# Patient Record
Sex: Female | Born: 1985 | Race: White | Hispanic: No | Marital: Single | Smoking: Current every day smoker
Health system: Southern US, Community
[De-identification: ages and names within clinical notes are randomized; demographics above are authoritative.]

## PROBLEM LIST (undated history)

## (undated) DIAGNOSIS — I10 Essential (primary) hypertension: Secondary | ICD-10-CM

## (undated) DIAGNOSIS — J449 Chronic obstructive pulmonary disease, unspecified: Secondary | ICD-10-CM

## (undated) DIAGNOSIS — E079 Disorder of thyroid, unspecified: Secondary | ICD-10-CM

## (undated) DIAGNOSIS — M1990 Unspecified osteoarthritis, unspecified site: Secondary | ICD-10-CM

---
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
## 2003-07-04 ENCOUNTER — Inpatient Hospital Stay (HOSPITAL_COMMUNITY): Admission: AD | Admit: 2003-07-04 | Discharge: 2003-07-04 | Payer: Self-pay | Admitting: *Deleted

## 2004-09-06 ENCOUNTER — Emergency Department: Payer: Self-pay | Admitting: Internal Medicine

## 2004-09-12 ENCOUNTER — Emergency Department: Payer: Self-pay | Admitting: Unknown Physician Specialty

## 2010-11-27 ENCOUNTER — Inpatient Hospital Stay: Payer: Self-pay | Admitting: Psychiatry

## 2010-12-25 ENCOUNTER — Inpatient Hospital Stay: Payer: Self-pay | Admitting: Psychiatry

## 2011-01-23 ENCOUNTER — Emergency Department: Payer: Self-pay | Admitting: Emergency Medicine

## 2018-05-01 ENCOUNTER — Other Ambulatory Visit: Payer: Self-pay

## 2018-05-01 ENCOUNTER — Emergency Department: Payer: Self-pay

## 2018-05-01 ENCOUNTER — Emergency Department
Admission: EM | Admit: 2018-05-01 | Discharge: 2018-05-01 | Disposition: A | Discharge status: Home / Self Care | Payer: Self-pay | Attending: Emergency Medicine | Admitting: Emergency Medicine

## 2018-05-01 ENCOUNTER — Encounter: Payer: Self-pay | Admitting: Emergency Medicine

## 2018-05-01 DIAGNOSIS — M25532 Pain in left wrist: Secondary | ICD-10-CM | POA: Insufficient documentation

## 2018-05-01 DIAGNOSIS — W01198A Fall on same level from slipping, tripping and stumbling with subsequent striking against other object, initial encounter: Secondary | ICD-10-CM | POA: Insufficient documentation

## 2018-05-01 DIAGNOSIS — Y93K1 Activity, walking an animal: Secondary | ICD-10-CM | POA: Insufficient documentation

## 2018-05-01 DIAGNOSIS — Y998 Other external cause status: Secondary | ICD-10-CM | POA: Insufficient documentation

## 2018-05-01 DIAGNOSIS — Y929 Unspecified place or not applicable: Secondary | ICD-10-CM | POA: Insufficient documentation

## 2018-05-01 DIAGNOSIS — Z5321 Procedure and treatment not carried out due to patient leaving prior to being seen by health care provider: Secondary | ICD-10-CM | POA: Insufficient documentation

## 2018-05-01 DIAGNOSIS — S52532A Colles' fracture of left radius, initial encounter for closed fracture: Secondary | ICD-10-CM | POA: Insufficient documentation

## 2018-05-01 MED ORDER — HYDROCODONE-ACETAMINOPHEN 5-325 MG PO TABS
1.0000 | ORAL_TABLET | Freq: Once | ORAL | Status: AC
  Administered 2018-05-01: 1 via ORAL
  Filled 2018-05-01: qty 1

## 2018-05-01 MED ORDER — MELOXICAM 15 MG PO TABS: 15.0000 mg | ORAL_TABLET | Freq: Every day | ORAL | 0 refills | Status: DC

## 2018-05-01 MED ORDER — HYDROCODONE-ACETAMINOPHEN 5-325 MG PO TABS: 1.0000 | ORAL_TABLET | Freq: Four times a day (QID) | ORAL | 0 refills | Status: AC | PRN

## 2018-05-01 NOTE — ED Notes (Signed)
No answer when called several times from lobby 

## 2018-05-01 NOTE — ED Triage Notes (Signed)
Pt reports fell 2 nights ago and caught herself with her hands and hurt her left wrist. Pt with swelling noted. Pt states that she was here last pm but left because she did not want to wait.

## 2018-05-01 NOTE — Discharge Instructions (Signed)
Please call and schedule an appointment with orthopedics.  Return to the ER for symptoms of concern if unable to see primary care or orthopedics.

## 2018-05-01 NOTE — ED Triage Notes (Signed)
Pt reports MD called her and told her to come back because she had a fracture.

## 2018-05-01 NOTE — ED Notes (Signed)
See triage note  States she fell 2 days ago  Caught herself with hands   But pain is mainly to left wrist  Good pulses

## 2018-05-01 NOTE — ED Notes (Addendum)
Notified pt, per direction of Dr Manson Passey, of positive wrist fracture per radiology report and need for immediate treatment. Pt states she was unable to wait earlier tonight "because my ride couldn't wait". Pt states she will return to the ED as soon as possible for treatment.

## 2018-05-01 NOTE — ED Triage Notes (Signed)
Patient ambulatory to triage with steady gait, without difficulty or distress noted; pt reports fell catching self with left wrist on Wednesday night; swelling,discoloration and pain; strong radial pulse, W&D

## 2018-05-01 NOTE — ED Provider Notes (Signed)
[facility] Emergency Department Provider Note ____________________________________________  Time seen: Approximately 6:39 PM  I have reviewed the triage vital signs and the nursing notes.   HISTORY  Chief Complaint Fall and Wrist Pain    HPI Kelly Patterson is a 33 y.o. female who presents to the emergency department for evaluation and treatment of left wrist pain.  2 nights ago she was walking her dog and the dog jerked the leash which caused her to fall forward.  She landed with her left hand outstretched.  Since that time she has had some pain and increase in swelling.  Tylenol and ibuprofen have been used with little relief of pain.  History reviewed. No pertinent past medical history.  There are no active problems to display for this patient.   History reviewed. No pertinent surgical history.  Prior to Admission medications   Medication Sig Start Date End Date Taking? Authorizing Provider  HYDROcodone-acetaminophen (NORCO/VICODIN) 5-325 MG tablet Take 1 tablet by mouth every 6 (six) hours as needed for up to 3 days for severe pain. 05/01/18 05/04/18  Winner Valeriano, Rulon Eisenmenger B, FNP  meloxicam (MOBIC) 15 MG tablet Take 1 tablet (15 mg total) by mouth daily. 05/01/18   Chinita Pester, FNP    Allergies Patient has no known allergies.  No family history on file.  Social History Social History   Tobacco Use  . Smoking status: Not on file  Substance Use Topics  . Alcohol use: Not on file  . Drug use: Not on file    Review of Systems Constitutional: Negative for fever. Cardiovascular: Negative for chest pain. Respiratory: Negative for shortness of breath. Musculoskeletal: Positive for left wrist pain. Skin: Positive for swelling of the left hand and wrist Neurological: Negative for decrease in sensation  ____________________________________________   PHYSICAL EXAM:  VITAL SIGNS: ED Triage Vitals  Enc Vitals Group     BP 05/01/18 1718 (!) 155/96     Pulse Rate 05/01/18 1718 100     Resp 05/01/18 1718 20     Temp 05/01/18 1718 97.8 F (36.6 C)     Temp Source 05/01/18 1718 Oral     SpO2 05/01/18 1718 98 %     Weight 05/01/18 1719 140 lb (63.5 kg)     Height 05/01/18 1719 5\' 6"  (1.676 m)     Head Circumference --      Peak Flow --      Pain Score 05/01/18 1719 8     Pain Loc --      Pain Edu? --      Excl. in GC? --     Constitutional: Alert and oriented. Well appearing and in no acute distress. Eyes: Conjunctivae are clear without discharge or drainage Head: Atraumatic Neck: Supple. Respiratory: No cough. Respirations are even and unlabored. Musculoskeletal: Swelling is noted proximal to the left wrist, diffusely over the left wrist and hand. Neurologic: Motor and sensory function is intact. Skin: No open wounds or lesions over the left arm, wrist, and hand.  Psychiatric: Affect and behavior are appropriate.  ____________________________________________   LABS (all labs ordered are listed, but only abnormal results are displayed)  Labs Reviewed - No data to display ____________________________________________  RADIOLOGY  Image taken at 0 1:40 AM shows an acute comminuted mildly impacted distal radial metaphyseal fracture that extends into the apices and involves the articular surface.  There is 3 mm of distraction of the major fracture fragments at the articular surface. ____________________________________________   PROCEDURES  .  Splint Application Date/Time: 05/01/2018 6:42 PM Performed by: Chinita Pester, FNP Authorized by: Chinita Pester, FNP   Consent:    Consent obtained:  Verbal   Consent given by:  Patient Pre-procedure details:    Sensation:  Normal Procedure details:    Laterality:  Left   Splint type:  Sugar tong   Supplies:  Cotton padding, elastic bandage and Ortho-Glass Post-procedure details:    Pain:  Unchanged   Sensation:  Normal   Patient tolerance of procedure:  Tolerated well, no  immediate complications    ____________________________________________   INITIAL IMPRESSION / ASSESSMENT AND PLAN / ED COURSE  Kelly Patterson is a 33 y.o. who presents to the emergency department for treatment and evaluation after mechanical, non-syncopal fall 2 nights ago.  She is right-hand dominant.  She was placed and a sugar tong OCL as described above and will be given a sling.  She will be given meloxicam and Norco for pain.  Patient instructed to follow-up with orthopedics as soon as there is an opening.  She was also instructed to return to the emergency department for symptoms that change or worsen if unable schedule an appointment with orthopedics or primary care.  Medications  HYDROcodone-acetaminophen (NORCO/VICODIN) 5-325 MG per tablet 1 tablet (1 tablet Oral Given 05/01/18 1910)    Pertinent labs & imaging results that were available during my care of the patient were reviewed by me and considered in my medical decision making (see chart for details).  _________________________________________   FINAL CLINICAL IMPRESSION(S) / ED DIAGNOSES  Final diagnoses:  Closed Colles' fracture of left radius, initial encounter    ED Discharge Orders         Ordered    HYDROcodone-acetaminophen (NORCO/VICODIN) 5-325 MG tablet  Every 6 hours PRN     05/01/18 1847    meloxicam (MOBIC) 15 MG tablet  Daily     05/01/18 1847           If controlled substance prescribed during this visit, 12 month history viewed on the [facility] prior to issuing an initial prescription for Schedule II or III opiod.    Chinita Pester, FNP 05/02/18 0002    Rockne Menghini, MD 05/05/18 1251

## 2018-12-24 ENCOUNTER — Ambulatory Visit: Payer: Self-pay

## 2019-01-14 ENCOUNTER — Ambulatory Visit: Payer: Self-pay

## 2019-09-06 ENCOUNTER — Other Ambulatory Visit: Payer: Self-pay

## 2019-09-06 ENCOUNTER — Encounter: Payer: Self-pay | Admitting: Emergency Medicine

## 2019-09-06 ENCOUNTER — Emergency Department
Admission: EM | Admit: 2019-09-06 | Discharge: 2019-09-07 | Disposition: A | Discharge status: Home / Self Care | Payer: Self-pay | Attending: Emergency Medicine | Admitting: Emergency Medicine

## 2019-09-06 DIAGNOSIS — F10220 Alcohol dependence with intoxication, uncomplicated: Secondary | ICD-10-CM | POA: Insufficient documentation

## 2019-09-06 DIAGNOSIS — Z5321 Procedure and treatment not carried out due to patient leaving prior to being seen by health care provider: Secondary | ICD-10-CM | POA: Insufficient documentation

## 2019-09-06 LAB — COMPREHENSIVE METABOLIC PANEL
ALT: 67 U/L — ABNORMAL HIGH (ref 0–44)
AST: 128 U/L — ABNORMAL HIGH (ref 15–41)
Albumin: 4.3 g/dL (ref 3.5–5.0)
Alkaline Phosphatase: 81 U/L (ref 38–126)
Anion gap: 17 — ABNORMAL HIGH (ref 5–15)
BUN: <5 mg/dL — ABNORMAL LOW (ref 6–20)
CO2: 18 mmol/L — ABNORMAL LOW (ref 22–32)
Calcium: 9.3 mg/dL (ref 8.9–10.3)
Chloride: 98 mmol/L (ref 98–111)
Creatinine, Ser: 0.47 mg/dL (ref 0.44–1.00)
GFR calc Af Amer: >60 mL/min (ref >60)
GFR calc non Af Amer: >60 mL/min (ref >60)
Glucose, Bld: 92 mg/dL (ref 70–99)
Potassium: 3.9 mmol/L (ref 3.5–5.1)
Sodium: 133 mmol/L — ABNORMAL LOW (ref 135–145)
Total Bilirubin: 0.8 mg/dL (ref 0.3–1.2)
Total Protein: 9 g/dL — ABNORMAL HIGH (ref 6.5–8.1)

## 2019-09-06 LAB — CBC
HCT: 42 % (ref 36.0–46.0)
Hemoglobin: 14.6 g/dL (ref 12.0–15.0)
MCH: 36.2 pg — ABNORMAL HIGH (ref 26.0–34.0)
MCHC: 34.8 g/dL (ref 30.0–36.0)
MCV: 104.2 fL — ABNORMAL HIGH (ref 80.0–100.0)
Platelets: 181 10*3/uL (ref 150–400)
RBC: 4.03 MIL/uL (ref 3.87–5.11)
RDW: 12.9 % (ref 11.5–15.5)
WBC: 7.5 10*3/uL (ref 4.0–10.5)
nRBC: 0 % (ref 0.0–0.2)

## 2019-09-06 LAB — ETHANOL: Alcohol, Ethyl (B): 389 mg/dL (ref <10)

## 2019-09-06 NOTE — ED Triage Notes (Signed)
Pt reports she needs help with detox from alcohol and SI, denies having a plan to hurt herself, pt denies any HI, denies any visual or auditory hallucinations. Pt talks in complete sentences, tearful in triage, Pt reports drinks daily last drink before arriving to ER

## 2019-09-06 NOTE — ED Notes (Signed)
Pt refused to stay and change scrubs  RN advised pt to stay to to get dressed if discussed if she leaves she is leaving against medical advice, Pt stood up from [facility] and walked out the door. Reports she is going to smoke and come back

## 2019-09-06 NOTE — ED Notes (Signed)
Pt reports she wants to go out and smoke RN explained this is a non-smoking facility pt reports she is going out to smoke before getting dressed up reports she wants to smoke and spend 5 more minutes with her mother and then come and get the help that she wants.

## 2019-09-07 ENCOUNTER — Telehealth: Payer: Self-pay | Admitting: Emergency Medicine

## 2019-09-07 NOTE — Telephone Encounter (Signed)
Called patient due to lwot to inquire about condition and follow up plans. She says she is no better.  Her speech is clear and she sounds alert.  She wants to get into rehab for alcohol.  I told her that she can return any time if she decides to do so. I told her that we can help her.   I told her that she has to make the decision to stay and that she cannot go in and out.  She says she is going to return.

## 2019-09-12 ENCOUNTER — Other Ambulatory Visit: Payer: Self-pay

## 2019-09-12 DIAGNOSIS — Z5321 Procedure and treatment not carried out due to patient leaving prior to being seen by health care provider: Secondary | ICD-10-CM | POA: Insufficient documentation

## 2019-09-12 DIAGNOSIS — F329 Major depressive disorder, single episode, unspecified: Secondary | ICD-10-CM | POA: Insufficient documentation

## 2019-09-12 DIAGNOSIS — F1010 Alcohol abuse, uncomplicated: Secondary | ICD-10-CM | POA: Insufficient documentation

## 2019-09-12 LAB — URINE DRUG SCREEN, QUALITATIVE (ARMC ONLY)
Amphetamines, Ur Screen: NOT DETECTED
Barbiturates, Ur Screen: NOT DETECTED
Benzodiazepine, Ur Scrn: NOT DETECTED
Cannabinoid 50 Ng, Ur ~~LOC~~: POSITIVE — AB
Cocaine Metabolite,Ur ~~LOC~~: NOT DETECTED
MDMA (Ecstasy)Ur Screen: NOT DETECTED
Methadone Scn, Ur: NOT DETECTED
Opiate, Ur Screen: NOT DETECTED
Phencyclidine (PCP) Ur S: NOT DETECTED
Tricyclic, Ur Screen: NOT DETECTED

## 2019-09-12 LAB — COMPREHENSIVE METABOLIC PANEL
ALT: 79 U/L — ABNORMAL HIGH (ref 0–44)
AST: 195 U/L — ABNORMAL HIGH (ref 15–41)
Albumin: 3.9 g/dL (ref 3.5–5.0)
Alkaline Phosphatase: 79 U/L (ref 38–126)
Anion gap: 15 (ref 5–15)
BUN: <5 mg/dL — ABNORMAL LOW (ref 6–20)
CO2: 20 mmol/L — ABNORMAL LOW (ref 22–32)
Calcium: 9.1 mg/dL (ref 8.9–10.3)
Chloride: 99 mmol/L (ref 98–111)
Creatinine, Ser: 0.44 mg/dL (ref 0.44–1.00)
GFR calc Af Amer: >60 mL/min (ref >60)
GFR calc non Af Amer: >60 mL/min (ref >60)
Glucose, Bld: 98 mg/dL (ref 70–99)
Potassium: 3.9 mmol/L (ref 3.5–5.1)
Sodium: 134 mmol/L — ABNORMAL LOW (ref 135–145)
Total Bilirubin: 0.7 mg/dL (ref 0.3–1.2)
Total Protein: 8.2 g/dL — ABNORMAL HIGH (ref 6.5–8.1)

## 2019-09-12 LAB — CBC
HCT: 40.1 % (ref 36.0–46.0)
Hemoglobin: 14 g/dL (ref 12.0–15.0)
MCH: 36.3 pg — ABNORMAL HIGH (ref 26.0–34.0)
MCHC: 34.9 g/dL (ref 30.0–36.0)
MCV: 103.9 fL — ABNORMAL HIGH (ref 80.0–100.0)
Platelets: 186 10*3/uL (ref 150–400)
RBC: 3.86 MIL/uL — ABNORMAL LOW (ref 3.87–5.11)
RDW: 12.9 % (ref 11.5–15.5)
WBC: 7.1 10*3/uL (ref 4.0–10.5)
nRBC: 0 % (ref 0.0–0.2)

## 2019-09-12 LAB — ACETAMINOPHEN LEVEL: Acetaminophen (Tylenol), Serum: <10 ug/mL — ABNORMAL LOW (ref 10–30)

## 2019-09-12 LAB — SALICYLATE LEVEL: Salicylate Lvl: <7 mg/dL — ABNORMAL LOW (ref 7.0–30.0)

## 2019-09-12 LAB — ETHANOL: Alcohol, Ethyl (B): 446 mg/dL (ref <10)

## 2019-09-12 LAB — POC URINE PREG, ED: Preg Test, Ur: NEGATIVE

## 2019-09-12 MED ORDER — HYDROXYZINE HCL 25 MG PO TABS: 25.0000 mg | ORAL_TABLET | Freq: Once | ORAL | Status: DC

## 2019-09-12 NOTE — ED Triage Notes (Addendum)
Pt arrives to ER with mom (mom won't leave pt) for ETOH detox and depression. States last drink was right before coming. States "recently" to SI but not today, no plan to harm self, denies HI. Pt crying in triage.

## 2019-09-13 ENCOUNTER — Emergency Department
Admission: EM | Admit: 2019-09-13 | Discharge: 2019-09-13 | Disposition: A | Discharge status: Home / Self Care | Payer: Self-pay | Attending: Emergency Medicine | Admitting: Emergency Medicine

## 2019-09-13 NOTE — ED Notes (Signed)
Pt called for vital signs recheck.  No answer x3

## 2019-11-16 ENCOUNTER — Emergency Department
Admission: EM | Admit: 2019-11-16 | Discharge: 2019-11-17 | Disposition: A | Discharge status: Home / Self Care | Payer: Self-pay | Attending: Emergency Medicine | Admitting: Emergency Medicine

## 2019-11-16 ENCOUNTER — Encounter: Payer: Self-pay | Admitting: Emergency Medicine

## 2019-11-16 ENCOUNTER — Other Ambulatory Visit: Payer: Self-pay

## 2019-11-16 DIAGNOSIS — Z5321 Procedure and treatment not carried out due to patient leaving prior to being seen by health care provider: Secondary | ICD-10-CM | POA: Insufficient documentation

## 2019-11-16 DIAGNOSIS — R220 Localized swelling, mass and lump, head: Secondary | ICD-10-CM | POA: Insufficient documentation

## 2019-11-16 DIAGNOSIS — L539 Erythematous condition, unspecified: Secondary | ICD-10-CM | POA: Insufficient documentation

## 2019-11-16 LAB — CBC WITH DIFFERENTIAL/PLATELET
Abs Immature Granulocytes: 0.03 10*3/uL (ref 0.00–0.07)
Basophils Absolute: 0.1 10*3/uL (ref 0.0–0.1)
Basophils Relative: 1 %
Eosinophils Absolute: 0.2 10*3/uL (ref 0.0–0.5)
Eosinophils Relative: 2 %
HCT: 44.4 % (ref 36.0–46.0)
Hemoglobin: 15.8 g/dL — ABNORMAL HIGH (ref 12.0–15.0)
Immature Granulocytes: 0 %
Lymphocytes Relative: 21 %
Lymphs Abs: 2.3 10*3/uL (ref 0.7–4.0)
MCH: 36.7 pg — ABNORMAL HIGH (ref 26.0–34.0)
MCHC: 35.6 g/dL (ref 30.0–36.0)
MCV: 103 fL — ABNORMAL HIGH (ref 80.0–100.0)
Monocytes Absolute: 1 10*3/uL (ref 0.1–1.0)
Monocytes Relative: 9 %
Neutro Abs: 7.4 10*3/uL (ref 1.7–7.7)
Neutrophils Relative %: 67 %
Platelets: 230 10*3/uL (ref 150–400)
RBC: 4.31 MIL/uL (ref 3.87–5.11)
RDW: 12.8 % (ref 11.5–15.5)
WBC: 10.9 10*3/uL — ABNORMAL HIGH (ref 4.0–10.5)
nRBC: 0 % (ref 0.0–0.2)

## 2019-11-16 LAB — COMPREHENSIVE METABOLIC PANEL
ALT: 28 U/L (ref 0–44)
AST: 34 U/L (ref 15–41)
Albumin: 4.2 g/dL (ref 3.5–5.0)
Alkaline Phosphatase: 79 U/L (ref 38–126)
Anion gap: 13 (ref 5–15)
BUN: <5 mg/dL — ABNORMAL LOW (ref 6–20)
CO2: 24 mmol/L (ref 22–32)
Calcium: 9.3 mg/dL (ref 8.9–10.3)
Chloride: 98 mmol/L (ref 98–111)
Creatinine, Ser: 0.39 mg/dL — ABNORMAL LOW (ref 0.44–1.00)
GFR calc Af Amer: >60 mL/min (ref >60)
GFR calc non Af Amer: >60 mL/min (ref >60)
Glucose, Bld: 112 mg/dL — ABNORMAL HIGH (ref 70–99)
Potassium: 3.9 mmol/L (ref 3.5–5.1)
Sodium: 135 mmol/L (ref 135–145)
Total Bilirubin: 1.1 mg/dL (ref 0.3–1.2)
Total Protein: 9 g/dL — ABNORMAL HIGH (ref 6.5–8.1)

## 2019-11-16 LAB — LACTIC ACID, PLASMA: Lactic Acid, Venous: 2.2 mmol/L (ref 0.5–1.9)

## 2019-11-16 NOTE — ED Triage Notes (Signed)
Patient ambulatory to triage with steady gait, without difficulty or distress noted; pt reports x 3 days swelling to left jaw; pt with multiple small circular scabbed areas to arm; swelling & redness noted to left jaw

## 2019-11-17 ENCOUNTER — Telehealth: Payer: Self-pay | Admitting: Emergency Medicine

## 2019-11-17 NOTE — Telephone Encounter (Signed)
Called patient due to lwot to inquire about condition and follow up plans.  No answer and no voicemail  

## 2019-12-28 ENCOUNTER — Encounter: Payer: Self-pay | Admitting: Emergency Medicine

## 2019-12-28 ENCOUNTER — Emergency Department
Admission: EM | Admit: 2019-12-28 | Discharge: 2019-12-29 | Disposition: A | Discharge status: Home / Self Care | Payer: Self-pay | Attending: Emergency Medicine | Admitting: Emergency Medicine

## 2019-12-28 ENCOUNTER — Other Ambulatory Visit: Payer: Self-pay

## 2019-12-28 DIAGNOSIS — Z789 Other specified health status: Secondary | ICD-10-CM

## 2019-12-28 DIAGNOSIS — F1010 Alcohol abuse, uncomplicated: Secondary | ICD-10-CM | POA: Insufficient documentation

## 2019-12-28 DIAGNOSIS — F439 Reaction to severe stress, unspecified: Secondary | ICD-10-CM

## 2019-12-28 DIAGNOSIS — Z733 Stress, not elsewhere classified: Secondary | ICD-10-CM | POA: Insufficient documentation

## 2019-12-28 DIAGNOSIS — F419 Anxiety disorder, unspecified: Secondary | ICD-10-CM | POA: Insufficient documentation

## 2019-12-28 DIAGNOSIS — F1590 Other stimulant use, unspecified, uncomplicated: Secondary | ICD-10-CM | POA: Insufficient documentation

## 2019-12-28 DIAGNOSIS — F17200 Nicotine dependence, unspecified, uncomplicated: Secondary | ICD-10-CM | POA: Insufficient documentation

## 2019-12-28 LAB — CBC
HCT: 42.4 % (ref 36.0–46.0)
Hemoglobin: 14.7 g/dL (ref 12.0–15.0)
MCH: 36.9 pg — ABNORMAL HIGH (ref 26.0–34.0)
MCHC: 34.7 g/dL (ref 30.0–36.0)
MCV: 106.5 fL — ABNORMAL HIGH (ref 80.0–100.0)
Platelets: 201 10*3/uL (ref 150–400)
RBC: 3.98 MIL/uL (ref 3.87–5.11)
RDW: 13.1 % (ref 11.5–15.5)
WBC: 6.6 10*3/uL (ref 4.0–10.5)
nRBC: 0 % (ref 0.0–0.2)

## 2019-12-28 LAB — COMPREHENSIVE METABOLIC PANEL
ALT: 51 U/L — ABNORMAL HIGH (ref 0–44)
AST: 103 U/L — ABNORMAL HIGH (ref 15–41)
Albumin: 4.1 g/dL (ref 3.5–5.0)
Alkaline Phosphatase: 70 U/L (ref 38–126)
Anion gap: 14 (ref 5–15)
BUN: <5 mg/dL — ABNORMAL LOW (ref 6–20)
CO2: 19 mmol/L — ABNORMAL LOW (ref 22–32)
Calcium: 8.6 mg/dL — ABNORMAL LOW (ref 8.9–10.3)
Chloride: 99 mmol/L (ref 98–111)
Creatinine, Ser: 0.41 mg/dL — ABNORMAL LOW (ref 0.44–1.00)
GFR calc non Af Amer: >60 mL/min (ref >60)
Glucose, Bld: 92 mg/dL (ref 70–99)
Potassium: 4.1 mmol/L (ref 3.5–5.1)
Sodium: 132 mmol/L — ABNORMAL LOW (ref 135–145)
Total Bilirubin: 0.8 mg/dL (ref 0.3–1.2)
Total Protein: 8.5 g/dL — ABNORMAL HIGH (ref 6.5–8.1)

## 2019-12-28 LAB — ETHANOL: Alcohol, Ethyl (B): 343 mg/dL (ref <10)

## 2019-12-28 LAB — SALICYLATE LEVEL: Salicylate Lvl: <7 mg/dL — ABNORMAL LOW (ref 7.0–30.0)

## 2019-12-28 LAB — ACETAMINOPHEN LEVEL: Acetaminophen (Tylenol), Serum: <10 ug/mL — ABNORMAL LOW (ref 10–30)

## 2019-12-28 NOTE — ED Notes (Signed)
While dressing the patient out the patient states that she wants to leave. That she is not ready and that she is scared of going through detox. Dr. Derrill Kay to triage to see patient. Patient states to Dr. Derrill Kay that she is not going to hurt herself and that she wants to leave.

## 2019-12-28 NOTE — ED Triage Notes (Signed)
Patient states that she has been feeling suicidal for two days. Patient states that she would cut herself. Patient states that she has a history of cutting.

## 2019-12-28 NOTE — ED Provider Notes (Signed)
[facility] Emergency Department Provider Note  ____________________________________________   I have reviewed the triage vital signs and the nursing notes.   HISTORY  Chief Complaint Psychiatric Evaluation   History limited by: Not Limited   HPI Kelly Patterson is a 34 y.o. female who presents to the emergency department today because of concerns for stress, anxiety and alcohol abuse.  Patient states that for the past few weeks she has felt increasingly anxious and stressed out. She says this has to do with her work and living situation. She has a history of cutting herself but she has never done it in an attempt to kill or harm herself. She says she has had intermittent thoughts of cutting over the past few days. Denies that she would try to kill herself. The patient also says that she is an alcohol abuser and has been trying to find a detox facility but has not found one that would accept her at this time.   Records reviewed. Per medical record review patient has a history of alcohol abuse  History reviewed. No pertinent past medical history.  There are no problems to display for this patient.   History reviewed. No pertinent surgical history.  Prior to Admission medications   Medication Sig Start Date End Date Taking? Authorizing Provider  meloxicam (MOBIC) 15 MG tablet Take 1 tablet (15 mg total) by mouth daily. 05/01/18   Chinita Pester, FNP    Allergies Patient has no known allergies.  No family history on file.  Social History Social History   Tobacco Use  . Smoking status: Current Every Day Smoker  . Smokeless tobacco: Never Used  Vaping Use  . Vaping Use: Never used  Substance Use Topics  . Alcohol use: Yes    Comment: patient states that she drinks 15 12oz beers daily  . Drug use: Yes    Types: Marijuana    Review of Systems Constitutional: No fever/chills Eyes: No visual changes. ENT: No sore throat. Cardiovascular: Denies  chest pain. Respiratory: Denies shortness of breath. Gastrointestinal: No abdominal pain.  No nausea, no vomiting.  No diarrhea.   Genitourinary: Negative for dysuria. Musculoskeletal: Negative for back pain. Skin: Negative for rash. Neurological: Negative for headaches, focal weakness or numbness.  ____________________________________________   PHYSICAL EXAM:  VITAL SIGNS: ED Triage Vitals  Enc Vitals Group     BP 12/28/19 2145 113/67     Pulse Rate 12/28/19 2145 (!) 107     Resp 12/28/19 2145 18     Temp 12/28/19 2145 98.4 F (36.9 C)     Temp Source 12/28/19 2145 Oral     SpO2 12/28/19 2145 95 %     Weight 12/28/19 2141 140 lb (63.5 kg)     Height 12/28/19 2141 5\' 6"  (1.676 m)     Head Circumference --      Peak Flow --      Pain Score 12/28/19 2141 0   Constitutional: Alert and oriented.  Eyes: Conjunctivae are normal.  ENT      Head: Normocephalic and atraumatic.      Nose: No congestion/rhinnorhea.      Mouth/Throat: Mucous membranes are moist.      Neck: No stridor. Hematological/Lymphatic/Immunilogical: No cervical lymphadenopathy. Cardiovascular: Normal rate, regular rhythm.  No murmurs, rubs, or gallops.  Respiratory: Normal respiratory effort without tachypnea nor retractions. Breath sounds are clear and equal bilaterally. No wheezes/rales/rhonchi. Gastrointestinal: Soft and non tender. No rebound. No guarding.  Genitourinary: Deferred Musculoskeletal:  Normal range of motion in all extremities. No lower extremity edema. Neurologic:  Normal speech and language. No gross focal neurologic deficits are appreciated.  Skin:  Skin is warm, dry and intact. No rash noted. Psychiatric: Mood and affect are normal. Speech and behavior are normal. Patient exhibits appropriate insight and judgment.  ____________________________________________    LABS (pertinent  positives/negatives)  None  ____________________________________________   EKG  None  ____________________________________________    RADIOLOGY  None  ____________________________________________   PROCEDURES  Procedures  ____________________________________________   INITIAL IMPRESSION / ASSESSMENT AND PLAN / ED COURSE  Pertinent labs & imaging results that were available during my care of the patient were reviewed by me and considered in my medical decision making (see chart for details).   Patient presented both because of concern for anxiety and stress as well as alcohol abuse.  In terms of her anxiety, stress I do think she is somewhat depressed.  She denies that she would actually try to kill her self.  She states that she has come in the past but without an attempt to actually harm or kill herself.  States she has had some intermittent thoughts of this over the past few days.  Time my exam the patient states she no longer wanted to wait to speak to psychiatry.  She is somewhat worried about going through alcohol withdrawal symptoms.  I did offer to medicate the patient if she was to start going through withdrawal symptoms.  She however decided she would like to leave.  Did encourage patient to return at any time.  ____________________________________________   FINAL CLINICAL IMPRESSION(S) / ED DIAGNOSES  Final diagnoses:  Stress  Alcohol use     Note: This dictation was prepared with Dragon dictation. Any transcriptional errors that result from this process are unintentional     Phineas Semen, MD 12/28/19 2241

## 2019-12-28 NOTE — Discharge Instructions (Addendum)
Please seek medical attention and help for any thoughts about wanting to harm yourself, harm others, any concerning change in behavior, severe depression, inappropriate drug use or any other new or concerning symptoms. ° °

## 2019-12-28 NOTE — ED Notes (Signed)
Patient changed into hospital scrubs by this RN and Willis Modena. Patient belongings placed in a belonging bag and given to primary RN. Patient with a silver color ring with a red stone, 3 hair ties and one hair clip, Wendy's hat, black shoes, black pants, wendy's shirt, book bag cell phone, cigarettes and lighter.

## 2020-08-26 ENCOUNTER — Emergency Department: Payer: Self-pay

## 2020-08-26 ENCOUNTER — Other Ambulatory Visit: Payer: Self-pay

## 2020-08-26 ENCOUNTER — Inpatient Hospital Stay
Admission: EM | Admit: 2020-08-26 | Discharge: 2020-08-27 | DRG: 193 | Discharge status: Against Medical Advice | Payer: Self-pay | Attending: Family Medicine | Admitting: Family Medicine

## 2020-08-26 DIAGNOSIS — Z5329 Procedure and treatment not carried out because of patient's decision for other reasons: Secondary | ICD-10-CM | POA: Diagnosis not present

## 2020-08-26 DIAGNOSIS — R569 Unspecified convulsions: Secondary | ICD-10-CM

## 2020-08-26 DIAGNOSIS — Z20822 Contact with and (suspected) exposure to covid-19: Secondary | ICD-10-CM | POA: Diagnosis present

## 2020-08-26 DIAGNOSIS — Z28310 Unvaccinated for covid-19: Secondary | ICD-10-CM

## 2020-08-26 DIAGNOSIS — U071 COVID-19: Secondary | ICD-10-CM

## 2020-08-26 DIAGNOSIS — Z791 Long term (current) use of non-steroidal anti-inflammatories (NSAID): Secondary | ICD-10-CM

## 2020-08-26 DIAGNOSIS — R0902 Hypoxemia: Secondary | ICD-10-CM

## 2020-08-26 DIAGNOSIS — R0602 Shortness of breath: Secondary | ICD-10-CM

## 2020-08-26 DIAGNOSIS — R6520 Severe sepsis without septic shock: Secondary | ICD-10-CM

## 2020-08-26 DIAGNOSIS — J189 Pneumonia, unspecified organism: Principal | ICD-10-CM

## 2020-08-26 DIAGNOSIS — Z833 Family history of diabetes mellitus: Secondary | ICD-10-CM

## 2020-08-26 DIAGNOSIS — F1290 Cannabis use, unspecified, uncomplicated: Secondary | ICD-10-CM | POA: Diagnosis present

## 2020-08-26 DIAGNOSIS — A419 Sepsis, unspecified organism: Secondary | ICD-10-CM

## 2020-08-26 DIAGNOSIS — R000 Tachycardia, unspecified: Secondary | ICD-10-CM | POA: Diagnosis present

## 2020-08-26 DIAGNOSIS — Z8249 Family history of ischemic heart disease and other diseases of the circulatory system: Secondary | ICD-10-CM

## 2020-08-26 DIAGNOSIS — J42 Unspecified chronic bronchitis: Secondary | ICD-10-CM | POA: Diagnosis present

## 2020-08-26 DIAGNOSIS — J441 Chronic obstructive pulmonary disease with (acute) exacerbation: Secondary | ICD-10-CM | POA: Diagnosis present

## 2020-08-26 DIAGNOSIS — Z8616 Personal history of COVID-19: Secondary | ICD-10-CM

## 2020-08-26 DIAGNOSIS — F17200 Nicotine dependence, unspecified, uncomplicated: Secondary | ICD-10-CM | POA: Diagnosis present

## 2020-08-26 DIAGNOSIS — E871 Hypo-osmolality and hyponatremia: Secondary | ICD-10-CM | POA: Diagnosis present

## 2020-08-26 DIAGNOSIS — J1282 Pneumonia due to coronavirus disease 2019: Secondary | ICD-10-CM

## 2020-08-26 DIAGNOSIS — R197 Diarrhea, unspecified: Secondary | ICD-10-CM | POA: Diagnosis present

## 2020-08-26 DIAGNOSIS — Z823 Family history of stroke: Secondary | ICD-10-CM

## 2020-08-26 DIAGNOSIS — J9600 Acute respiratory failure, unspecified whether with hypoxia or hypercapnia: Secondary | ICD-10-CM | POA: Diagnosis present

## 2020-08-26 DIAGNOSIS — F10239 Alcohol dependence with withdrawal, unspecified: Secondary | ICD-10-CM

## 2020-08-26 DIAGNOSIS — J440 Chronic obstructive pulmonary disease with acute lower respiratory infection: Secondary | ICD-10-CM | POA: Diagnosis present

## 2020-08-26 DIAGNOSIS — J9601 Acute respiratory failure with hypoxia: Secondary | ICD-10-CM

## 2020-08-26 LAB — CBC WITH DIFFERENTIAL/PLATELET
Abs Immature Granulocytes: 0.04 10*3/uL (ref 0.00–0.07)
Basophils Absolute: 0.1 10*3/uL (ref 0.0–0.1)
Basophils Relative: 1 %
Eosinophils Absolute: 0 10*3/uL (ref 0.0–0.5)
Eosinophils Relative: 0 %
HCT: 51.4 % — ABNORMAL HIGH (ref 36.0–46.0)
Hemoglobin: 18.1 g/dL — ABNORMAL HIGH (ref 12.0–15.0)
Immature Granulocytes: 0 %
Lymphocytes Relative: 10 %
Lymphs Abs: 1.3 10*3/uL (ref 0.7–4.0)
MCH: 37 pg — ABNORMAL HIGH (ref 26.0–34.0)
MCHC: 35.2 g/dL (ref 30.0–36.0)
MCV: 105.1 fL — ABNORMAL HIGH (ref 80.0–100.0)
Monocytes Absolute: 1.5 10*3/uL — ABNORMAL HIGH (ref 0.1–1.0)
Monocytes Relative: 12 %
Neutro Abs: 9.7 10*3/uL — ABNORMAL HIGH (ref 1.7–7.7)
Neutrophils Relative %: 77 %
Platelets: 187 10*3/uL (ref 150–400)
RBC: 4.89 MIL/uL (ref 3.87–5.11)
RDW: 11.9 % (ref 11.5–15.5)
Smear Review: NORMAL
WBC: 12.7 10*3/uL — ABNORMAL HIGH (ref 4.0–10.5)
nRBC: 0 % (ref 0.0–0.2)

## 2020-08-26 LAB — URINE DRUG SCREEN, QUALITATIVE (ARMC ONLY)
Amphetamines, Ur Screen: NOT DETECTED
Barbiturates, Ur Screen: NOT DETECTED
Benzodiazepine, Ur Scrn: NOT DETECTED
Cannabinoid 50 Ng, Ur ~~LOC~~: POSITIVE — AB
Cocaine Metabolite,Ur ~~LOC~~: NOT DETECTED
MDMA (Ecstasy)Ur Screen: NOT DETECTED
Methadone Scn, Ur: NOT DETECTED
Opiate, Ur Screen: NOT DETECTED
Phencyclidine (PCP) Ur S: NOT DETECTED
Tricyclic, Ur Screen: NOT DETECTED

## 2020-08-26 LAB — BLOOD GAS, ARTERIAL
Acid-Base Excess: 1.8 mmol/L (ref 0.0–2.0)
Bicarbonate: 24.6 mmol/L (ref 20.0–28.0)
FIO2: 0.6
O2 Saturation: 94.6 %
Patient temperature: 37
pCO2 arterial: 33 mmHg (ref 32.0–48.0)
pH, Arterial: 7.48 — ABNORMAL HIGH (ref 7.350–7.450)
pO2, Arterial: 68 mmHg — ABNORMAL LOW (ref 83.0–108.0)

## 2020-08-26 LAB — LACTIC ACID, PLASMA
Lactic Acid, Venous: 3.5 mmol/L (ref 0.5–1.9)
Lactic Acid, Venous: 4.3 mmol/L (ref 0.5–1.9)

## 2020-08-26 LAB — HCG, QUANTITATIVE, PREGNANCY: hCG, Beta Chain, Quant, S: <1 m[IU]/mL (ref <5)

## 2020-08-26 LAB — COMPREHENSIVE METABOLIC PANEL
ALT: 20 U/L (ref 0–44)
AST: 26 U/L (ref 15–41)
Albumin: 3.9 g/dL (ref 3.5–5.0)
Alkaline Phosphatase: 63 U/L (ref 38–126)
Anion gap: 16 — ABNORMAL HIGH (ref 5–15)
BUN: 11 mg/dL (ref 6–20)
CO2: 23 mmol/L (ref 22–32)
Calcium: 9.5 mg/dL (ref 8.9–10.3)
Chloride: 95 mmol/L — ABNORMAL LOW (ref 98–111)
Creatinine, Ser: 0.53 mg/dL (ref 0.44–1.00)
GFR, Estimated: >60 mL/min (ref >60)
Glucose, Bld: 142 mg/dL — ABNORMAL HIGH (ref 70–99)
Potassium: 3.7 mmol/L (ref 3.5–5.1)
Sodium: 134 mmol/L — ABNORMAL LOW (ref 135–145)
Total Bilirubin: 1.4 mg/dL — ABNORMAL HIGH (ref 0.3–1.2)
Total Protein: 9 g/dL — ABNORMAL HIGH (ref 6.5–8.1)

## 2020-08-26 LAB — RESP PANEL BY RT-PCR (FLU A&B, COVID) ARPGX2
Influenza A by PCR: NEGATIVE
Influenza A by PCR: NEGATIVE
Influenza B by PCR: NEGATIVE
Influenza B by PCR: NEGATIVE
SARS Coronavirus 2 by RT PCR: NEGATIVE
SARS Coronavirus 2 by RT PCR: NEGATIVE

## 2020-08-26 LAB — D-DIMER, QUANTITATIVE: D-Dimer, Quant: 1.38 ug/mL-FEU — ABNORMAL HIGH (ref 0.00–0.50)

## 2020-08-26 LAB — BRAIN NATRIURETIC PEPTIDE: B Natriuretic Peptide: 90.9 pg/mL (ref 0.0–100.0)

## 2020-08-26 LAB — FIBRINOGEN: Fibrinogen: 705 mg/dL — ABNORMAL HIGH (ref 210–475)

## 2020-08-26 MED ORDER — ASCORBIC ACID 500 MG PO TABS: 500.0000 mg | ORAL_TABLET | Freq: Every day | ORAL | Status: DC

## 2020-08-26 MED ORDER — LORAZEPAM 2 MG/ML IJ SOLN
1.0000 mg | INTRAMUSCULAR | Status: DC | PRN
  Administered 2020-08-26: 1 mg via INTRAVENOUS
  Filled 2020-08-26: qty 1

## 2020-08-26 MED ORDER — SODIUM CHLORIDE 0.9 % IV SOLN
1.0000 g | Freq: Once | INTRAVENOUS | Status: AC
  Administered 2020-08-26: 1 g via INTRAVENOUS
  Filled 2020-08-26: qty 10

## 2020-08-26 MED ORDER — SODIUM CHLORIDE 0.9 % IV SOLN
200.0000 mg | Freq: Once | INTRAVENOUS | Status: DC
  Filled 2020-08-26: qty 40

## 2020-08-26 MED ORDER — ONDANSETRON HCL 4 MG/2ML IJ SOLN: 4.0000 mg | Freq: Four times a day (QID) | INTRAMUSCULAR | Status: DC | PRN

## 2020-08-26 MED ORDER — LACTATED RINGERS IV BOLUS
1000.0000 mL | Freq: Once | INTRAVENOUS | Status: AC
  Administered 2020-08-26: 1000 mL via INTRAVENOUS

## 2020-08-26 MED ORDER — GUAIFENESIN-DM 100-10 MG/5ML PO SYRP: 10.0000 mL | ORAL_SOLUTION | ORAL | Status: DC | PRN

## 2020-08-26 MED ORDER — LORAZEPAM 2 MG/ML IJ SOLN
1.0000 mg | Freq: Once | INTRAMUSCULAR | Status: AC
  Administered 2020-08-26: 1 mg via INTRAVENOUS
  Filled 2020-08-26: qty 1

## 2020-08-26 MED ORDER — ACETAMINOPHEN 650 MG RE SUPP: 650.0000 mg | Freq: Four times a day (QID) | RECTAL | Status: DC | PRN

## 2020-08-26 MED ORDER — IOHEXOL 350 MG/ML SOLN
75.0000 mL | Freq: Once | INTRAVENOUS | Status: AC | PRN
  Administered 2020-08-26: 75 mL via INTRAVENOUS

## 2020-08-26 MED ORDER — ZINC SULFATE 220 (50 ZN) MG PO CAPS: 220.0000 mg | ORAL_CAPSULE | Freq: Every day | ORAL | Status: DC

## 2020-08-26 MED ORDER — SODIUM CHLORIDE 0.9 % IV SOLN: 2.0000 g | INTRAVENOUS | Status: DC

## 2020-08-26 MED ORDER — SODIUM CHLORIDE 0.9 % IV SOLN
100.0000 mg | Freq: Every day | INTRAVENOUS | Status: DC
  Filled 2020-08-26: qty 20

## 2020-08-26 MED ORDER — ACETAMINOPHEN 325 MG PO TABS: 650.0000 mg | ORAL_TABLET | Freq: Four times a day (QID) | ORAL | Status: DC | PRN

## 2020-08-26 MED ORDER — HYDROCOD POLST-CPM POLST ER 10-8 MG/5ML PO SUER: 5.0000 mL | Freq: Two times a day (BID) | ORAL | Status: DC | PRN

## 2020-08-26 MED ORDER — THIAMINE HCL 100 MG/ML IJ SOLN
Freq: Once | INTRAVENOUS | Status: DC
  Filled 2020-08-26: qty 1000

## 2020-08-26 MED ORDER — METHYLPREDNISOLONE SODIUM SUCC 125 MG IJ SOLR
1.0000 mg/kg | Freq: Two times a day (BID) | INTRAMUSCULAR | Status: DC
  Administered 2020-08-26: 56.875 mg via INTRAVENOUS
  Filled 2020-08-26: qty 2

## 2020-08-26 MED ORDER — ENOXAPARIN SODIUM 40 MG/0.4ML IJ SOSY
40.0000 mg | PREFILLED_SYRINGE | INTRAMUSCULAR | Status: DC
  Administered 2020-08-26: 40 mg via SUBCUTANEOUS
  Filled 2020-08-26: qty 0.4

## 2020-08-26 MED ORDER — IPRATROPIUM-ALBUTEROL 20-100 MCG/ACT IN AERS
1.0000 | INHALATION_SPRAY | Freq: Four times a day (QID) | RESPIRATORY_TRACT | Status: DC
  Filled 2020-08-26: qty 4

## 2020-08-26 MED ORDER — SODIUM CHLORIDE 0.9 % IV SOLN: 500.0000 mg | INTRAVENOUS | Status: DC

## 2020-08-26 MED ORDER — PREDNISONE 20 MG PO TABS: 50.0000 mg | ORAL_TABLET | Freq: Every day | ORAL | Status: DC

## 2020-08-26 MED ORDER — VITAMIN D 25 MCG (1000 UNIT) PO TABS: 1000.0000 [IU] | ORAL_TABLET | Freq: Every day | ORAL | Status: DC

## 2020-08-26 MED ORDER — SODIUM CHLORIDE 0.9 % IV SOLN: INTRAVENOUS | Status: DC

## 2020-08-26 MED ORDER — MAGNESIUM HYDROXIDE 400 MG/5ML PO SUSP: 30.0000 mL | Freq: Every day | ORAL | Status: DC | PRN

## 2020-08-26 MED ORDER — ONDANSETRON HCL 4 MG PO TABS
4.0000 mg | ORAL_TABLET | Freq: Four times a day (QID) | ORAL | Status: DC | PRN
Start: 2020-08-26 — End: 2020-08-27

## 2020-08-26 MED ORDER — GUAIFENESIN ER 600 MG PO TB12
600.0000 mg | ORAL_TABLET | Freq: Two times a day (BID) | ORAL | Status: DC
  Administered 2020-08-26: 600 mg via ORAL
  Filled 2020-08-26: qty 1

## 2020-08-26 MED ORDER — FUROSEMIDE 10 MG/ML IJ SOLN
40.0000 mg | Freq: Once | INTRAMUSCULAR | Status: AC
  Administered 2020-08-26: 40 mg via INTRAVENOUS
  Filled 2020-08-26: qty 4

## 2020-08-26 MED ORDER — TRAZODONE HCL 50 MG PO TABS
25.0000 mg | ORAL_TABLET | Freq: Every evening | ORAL | Status: DC | PRN
  Administered 2020-08-26: 25 mg via ORAL
  Filled 2020-08-26: qty 1

## 2020-08-26 MED ORDER — SODIUM CHLORIDE 0.9 % IV SOLN
500.0000 mg | Freq: Once | INTRAVENOUS | Status: AC
  Administered 2020-08-26: 500 mg via INTRAVENOUS
  Filled 2020-08-26: qty 500

## 2020-08-26 MED ORDER — IPRATROPIUM-ALBUTEROL 0.5-2.5 (3) MG/3ML IN SOLN
3.0000 mL | Freq: Four times a day (QID) | RESPIRATORY_TRACT | Status: DC
  Administered 2020-08-26: 3 mL via RESPIRATORY_TRACT
  Filled 2020-08-26: qty 3

## 2020-08-26 MED ORDER — IOHEXOL 350 MG/ML SOLN
75.0000 mL | Freq: Once | INTRAVENOUS | Status: DC | PRN
Start: 2020-08-26 — End: 2020-08-27

## 2020-08-26 NOTE — ED Notes (Signed)
RN spoke with patient extensively regarding her condition and the concern for her desire to go home.  Educated of the dangers to go home in her condition and she is insistent that she go home once the antibiotics are done.    Mom is at the bedside.  Educated both that a risk of leaving could be worsening of condition, up to death.  Mom is tearful and asking the patient to stay in the hospital, but the patient continues to decline to stay.    Dr. Derrill Kay speaking with Mom as well to discuss the severity of her condition.

## 2020-08-26 NOTE — ED Notes (Signed)
1 set cultures sent to lab 

## 2020-08-26 NOTE — ED Notes (Signed)
Patient transported to CT 

## 2020-08-26 NOTE — ED Notes (Signed)
RN concerned with clinical presentation, contacted RT to come and make recommendation.  RT at bedside.

## 2020-08-26 NOTE — ED Notes (Signed)
ICU3 had been assigned, but was rejected per charge.  Patient to stay in ED overnight until bed available.  Mother of patient made aware.   Advised that because they are treating it is as a Covid diagnosis, no visitors would be allowed, per charge.

## 2020-08-26 NOTE — ED Notes (Signed)
Patient returned from CT

## 2020-08-26 NOTE — ED Notes (Signed)
Urine sent to lab by this RN

## 2020-08-26 NOTE — ED Triage Notes (Addendum)
Pt arrives via EMS from home for [facility], chest pain, fever- pt tested positive for covid 5 days ago with home test- pt was given 1 duoneb and 1 albuterol neb and 125 solumedrol by ems- pt arrives on 15L NR[facility]- pt was 85% on RA

## 2020-08-26 NOTE — ED Notes (Signed)
Mom came out and advised the patient has chosen to stay.  Informed Dr.    Patient was concerned due to her alcoholism that she would go into DT's.  Educated patient and mother that I have medications that I would be able to give to help with the possibility of DT.  Both verbalized understanding.

## 2020-08-26 NOTE — Progress Notes (Signed)
  Chaplain On-Call responded to the Code Sepsis notification.  Patient's condition and active treatment by the medical team rules out a Chaplain visit at this time.  Chaplain will be available as needed for further support.  Chaplain Evelena Peat M.Div., [facility]

## 2020-08-26 NOTE — ED Notes (Signed)
Respiratory placed patient on heated high flow.  40L

## 2020-08-26 NOTE — ED Notes (Signed)
Spoke with Dr. Arville Care regarding CIWA and the need for Ativan for patient due to etoh withdrawal.

## 2020-08-26 NOTE — ED Notes (Signed)
Spoke with Dr. Arville Care regarding using breathing treatments.

## 2020-08-26 NOTE — ED Notes (Signed)
Incontinent urine x2

## 2020-08-26 NOTE — ED Notes (Signed)
Pt placed on 4 L [place]  

## 2020-08-26 NOTE — ED Notes (Signed)
Pt up to 6L [place]- mother at bedside

## 2020-08-26 NOTE — H&P (Signed)
PATIENT NAME: Kelly Patterson  DATE OF BIRTH:  11/28/1985  DATE OF ADMISSION:  08/26/2020  PRIMARY CARE PHYSICIAN: Patient, No Pcp Per (Inactive)   Patient is coming from: Home  REQUESTING/REFERRING PHYSICIAN: Phineas SemenGoodman, [place], MD  CHIEF COMPLAINT:   Chief Complaint  Patient presents with  . Shortness of Breath    HISTORY OF PRESENT ILLNESS:  Kelly Patterson with history of alcohol abuse, presented to the ER with acute onset of dyspnea over the last 5 days with associated cough productive of yellowish sputum and wheezing.  She admitted to night sweats and chills but did not check her temperature.  She has been having occasional diarrhea.  She denies any loss of taste or smell she had a home test that was positive for COVID-19 5 days ago.  Her dizziness been getting significantly worse.  She drinks 15 X 12 ounce beer per day.  She admitted through vomiting about 5 times over the last 3 days while drinking.  She has not been vaccinated for COVID-19.  She denies any chest pain or palpitations.  She denies any exposure to chlorine or other irritants gas.  No previous history of chronic lung disease that she is aware of.  No dysuria, oliguria or hematuria or flank pain.  She denies any abdominal pain.  No bleeding diathesis.  ED Course: Upon presentation to the ER blood pressure was 153/92 with a heart rate of 116 respiratory to 40 and pulse oximetry was 97% on 15 L of O2 1 nonrebreather.  This was later 94% on 6 L of O2 by nasal cannula.  She was then placed on high flow nasal cannula 40 L/min.  She did not tolerate BiPAP.  Her blood pressure has improved to 121/79 however heart rate was still 135 and respiratory 30.  Labs revealed negative COVID-19 PCR twice and negative influenza antigens.  Lactic acid of 3.5 then 4.3.  Blood cultures drawn.  CMP was remarkable for mild hyponatremia anion gap of 16 with total bili of 1.4 and  total protein of 9 with albumin of 3.9.  CBC showed leukocytosis 12.7 with neutrophilia and hemoconcentration.  EKG as reviewed by me : Showed sinus tachycardia with a rate of 159 with anterior Q waves, LVH.  Repeat EKG showed sinus tachycardia with a rate of 155 with LVH and Q waves in V1 through V3. Imaging: Chest x-ray showed diffuse interstitial opacities patchy basilar and peripheral predominant airspace opacities consistent with multifocal pneumonia and possibly COVID-19. Chest CTA revealed: 1. Patchy consolidations throughout both lungs, majority of which are ground-glass opacity. Differential includes atypical pneumonias such as viral or fungal, interstitial pneumonias, hypersensitivity pneumonitis, and respiratory bronchiolitis. Favor multifocal atypical pneumonia. COVID pneumonia can have this appearance. 2. Additional micronodularity/miliary opacities within both lungs, LEFT greater than RIGHT. This increases the differential to include miliary tuberculosis, sarcoidosis, pneumoconioses, and neoplastic process/metastasis. 3. Moderately enlarged lymph nodes within the bilateral perihilar regions and within the RIGHT upper paratracheal space, most likely reactive. 4. No large obstructing/saddle thrombus within the main or central lobar pulmonary arteries. Patient breathing motion artifacts significantly limits characterization of the peripheral segmental and subsegmental pulmonary arteries bilaterally and small peripheral pulmonary embolus cannot be excluded on this exam.  The patient was given IV Rocephin and Zithromax, 2 L bolus of IV lactated ringer and 1 mg of IV Ativan twice.  She will be admitted to  stepdown unit bed for further evaluation and management  PAST MEDICAL HISTORY:  Ongoing tobacco and EtOH abuse with alcohol dependence.  PAST SURGICAL HISTORY:  She denies any previous surgeries.  SOCIAL HISTORY:   Social History   Tobacco Use  . Smoking status: Current  Every Day Smoker  . Smokeless tobacco: Never Used  Substance Use Topics  . Alcohol use: Yes    Comment: patient states that she drinks 15 12oz beers daily  She smokes 1 pack of cigarettes per day and has been smoking for 18 years.  FAMILY HISTORY:  Positive for diabetes mellitus, hypertension, CVA, cancer and MI.  DRUG ALLERGIES:  No Known Allergies  REVIEW OF SYSTEMS:   ROS As per history of present illness. All pertinent systems were reviewed above. Constitutional, HEENT, cardiovascular, respiratory, GI, GU, musculoskeletal, neuro, psychiatric, endocrine, integumentary and hematologic systems were reviewed and are otherwise negative/unremarkable except for positive findings mentioned above in the HPI.   MEDICATIONS AT HOME:   Prior to Admission medications   Not on File      VITAL SIGNS:  Blood pressure 121/79, pulse (!) 133, temperature 98.2 F (36.8 C), temperature source Oral, resp. rate (!) 30, height 5\' 6"  (1.676 m), weight 56.7 kg, SpO2 94 %.  PHYSICAL EXAMINATION:  Physical Exam  GENERAL: Acutely ill 35 y.o.-year-old Caucasian Patterson patient sitting in the bed with mild to moderate respiratory distress on high flow nasal cannula, continuing to be tachypneic. EYES: Pupils equal, round, reactive to light and accommodation. No scleral icterus. Extraocular muscles intact.  HEENT: Head atraumatic, normocephalic. Oropharynx and nasopharynx clear.  NECK:  Supple, no jugular venous distention. No thyroid enlargement, no tenderness.  LUNGS: Diffusely coarse breath sounds with diffuse expiratory rhonchi and wheezes and harsh vesicular breathing with tight expiratory airflow and bibasal crackles. CARDIOVASCULAR: Regular rate and rhythm, S1, S2 normal. No murmurs, rubs, or gallops.  ABDOMEN: Soft, nondistended, nontender. Bowel sounds present. No organomegaly or mass.  EXTREMITIES: No pedal edema, cyanosis, or clubbing.  NEUROLOGIC: Cranial nerves II through XII are intact.  Muscle strength 5/5 in all extremities. Sensation intact. Gait not checked.  PSYCHIATRIC: The patient is alert and oriented x 3.  Normal affect and good eye contact. SKIN: No obvious rash, lesion, or ulcer.   LABORATORY PANEL:   CBC Recent Labs  Lab 08/26/20 1531  WBC 12.7*  HGB 18.1*  HCT 51.4*  PLT 187   ------------------------------------------------------------------------------------------------------------------  Chemistries  Recent Labs  Lab 08/26/20 1531  NA 134*  K 3.7  CL 95*  CO2 23  GLUCOSE 142*  BUN 11  CREATININE 0.53  CALCIUM 9.5  AST 26  ALT 20  ALKPHOS 63  BILITOT 1.4*   ------------------------------------------------------------------------------------------------------------------  Cardiac Enzymes No results for input(s): TROPONINI in the last 168 hours. ------------------------------------------------------------------------------------------------------------------  RADIOLOGY:  CT Angio Chest PE W and/or Wo Contrast  Result Date: 08/26/2020 CLINICAL DATA:  Hypoxia EXAM: CT ANGIOGRAPHY CHEST WITH CONTRAST TECHNIQUE: Multidetector CT imaging of the chest was performed using the standard protocol during bolus administration of intravenous contrast. Multiplanar CT image reconstructions and MIPs were obtained to evaluate the vascular anatomy. CONTRAST:  38mL OMNIPAQUE IOHEXOL 350 MG/ML SOLN COMPARISON:  None. FINDINGS: Cardiovascular: Patient breathing motion artifacts significantly limits characterization of the segmental and subsegmental pulmonary arteries bilaterally for pulmonary emboli. All that can be confidently said is that there is no large obstructing/saddle thrombus within the main or central lobar pulmonary arteries bilaterally. No thoracic aortic aneurysm or evidence of aortic dissection. No pericardial  effusion. Mediastinum/Nodes: Moderately enlarged lymph nodes are seen within the bilateral perihilar regions and within the RIGHT upper  paratracheal space. Additional scattered smaller lymph nodes are present throughout the mediastinum. Esophagus is unremarkable. Trachea and central bronchi are unremarkable. Lungs/Pleura: Patchy consolidations throughout both lungs, majority of which are ground-glass opacity. Additional micronodularity within both lungs, LEFT slightly greater than RIGHT. No pleural effusion or pneumothorax. Upper Abdomen: Limited images of the upper abdomen are unremarkable. Musculoskeletal: No acute or significant osseous abnormality. Review of the MIP images confirms the above findings. IMPRESSION: 1. Patchy consolidations throughout both lungs, majority of which are ground-glass opacity. Differential includes atypical pneumonias such as viral or fungal, interstitial pneumonias, hypersensitivity pneumonitis, and respiratory bronchiolitis. Favor multifocal atypical pneumonia. COVID pneumonia can have this appearance. 2. Additional micronodularity/miliary opacities within both lungs, LEFT greater than RIGHT. This increases the differential to include miliary tuberculosis, sarcoidosis, pneumoconioses, and neoplastic process/metastasis. 3. Moderately enlarged lymph nodes within the bilateral perihilar regions and within the RIGHT upper paratracheal space, most likely reactive. 4. No large obstructing/saddle thrombus within the main or central lobar pulmonary arteries. Patient breathing motion artifacts significantly limits characterization of the peripheral segmental and subsegmental pulmonary arteries bilaterally and small peripheral pulmonary embolus cannot be excluded on this exam. Electronically Signed   By: Bary Richard M.D.   On: 08/26/2020 20:31   DG Chest Portable 1 View  Result Date: 08/26/2020 CLINICAL DATA:  Shortness of breath COVID positive EXAM: PORTABLE CHEST 1 VIEW COMPARISON:  None. FINDINGS: The heart size and mediastinal contours are within normal limits. Diffuse interstitial opacities with patchy basilar and  peripheral predominant airspace opacities. The visualized skeletal structures are unremarkable. IMPRESSION: Diffuse interstitial opacities with patchy basilar and peripheral predominant airspace opacities, consistent with multifocal infection and COVID-19 infection. Electronically Signed   By: Maudry Mayhew MD   On: 08/26/2020 16:14      IMPRESSION AND PLAN:  Active Problems:   Acute respiratory failure (HCC)  1.  Acute hypoxic respiratory failure secondary to atypical pneumonia highly suspicious for COVID-19 pneumonia on chest x-ray and chest CT with positive home COVID antigen test, despite negative COVID PCR twice here.  The patient has subsequent severe sepsis based on her tachypnea and tachycardia and leukocytosis with an elevated lactic acid.  She could be having underlying chronic bronchitis with her long-term smoking as well and associated COPD exacerbation. - The patient will be admitted to stepdown unit bed. - We will obtain inflammatory markers for COVID-19 and follow those daily. - We will obtain sputum gram stain culture and sensitivity and sputum AFB. - We will obtain urine Legionella and strep pneumonia antigens as well as mycoplasma IgM. - We will place the patient on IV steroid therapy with Solu-Medrol. - She will be placed bronchodilator therapy with Combivent Respimat on a scheduled and as needed basis. - We will follow blood cultures and lactic acid and add procalcitonin. - She will be placed on vitamin C, vitamin D3, zinc sulfate and aspirin. - We added BNP to her labs. - We added ABG that revealed a pH 7.48, PCO2 33, PO2 of 68 and O2 sat of 94.6% on 60% FiO2 on high flow nasal cannula.  2.  Alcohol withdrawal. - The patient will be placed on as needed IV Ativan and a banana bag daily.  3.  Ongoing tobacco abuse. - She was counseled for smoking cessation and will receive further counseling here.  DVT prophylaxis: Lovenox. Code Status: full code. Family  Communication:  The  plan of care was discussed in details with the patient (who requested no other family members to be notified at this time.). I answered all questions. The patient agreed to proceed with the above mentioned plan. Further management will depend upon hospital course. Disposition Plan: Back to previous home environment Consults called: Pulmonary consult.   All the records are reviewed and case discussed with ED provider.  Status is: Inpatient  Remains inpatient appropriate because:Ongoing diagnostic testing needed not appropriate for outpatient work up, Unsafe d/c plan, IV treatments appropriate due to intensity of illness or inability to take PO and Inpatient level of care appropriate due to severity of illness   Dispo: The patient is from: Home              Anticipated d/c is to: Home              Patient currently is not medically stable to d/c.   Difficult to place patient No Authorized and performed by: Valente David, MD Total critical care time: Approximately  60    minutes. Due to a high probability of clinically significant, life-threatening deterioration, the patient required my highest level of preparedness to intervene emergently and I personally spent this critical care time directly and personally managing the patient.  This critical care time included obtaining a history, examining the patient, pulse oximetry, ordering and review of studies, arranging urgent treatment with development of management plan, evaluation of patient's response to treatment, frequent reassessment, and discussions with other providers. This critical care time was performed to assess and manage the high probability of imminent, life-threatening deterioration that could result in multiorgan failure.  It was exclusive of separately billable procedures and treating other patients and teaching time.  Please see MDM section and the rest of the note for further information on patient assessment and  treatment.     Hannah Beat M.D on 08/26/2020 at 9:19 PM  [facility]   From 7 PM-7 AM, contact night-coverage [email or web address]  CC: Primary care physician; Patient, No Pcp Per (Inactive)

## 2020-08-26 NOTE — Progress Notes (Signed)
Remdesivir - Pharmacy Brief Note   O:  CXR: "Diffuse interstitial opacities with patchy basilar and peripheral predominant airspace opacities, consistent with multifocal infection and COVID-19 infection." SpO2: 88-99% on HFNC 40 L/min   A/P:  Remdesivir 200 mg IVPB once followed by 100 mg IVPB daily x 4 days.   Otelia Sergeant, PharmD, [facility] 08/26/2020 10:39 PM

## 2020-08-26 NOTE — ED Provider Notes (Signed)
[facility] Emergency Department Provider Note  ____________________________________________   I have reviewed the triage vital signs and the nursing notes.   HISTORY  Chief Complaint Shortness of Breath   History limited by: Not Limited   HPI Kelly Patterson is a 35 y.o. female who presents to the emergency department today because of concern for shortness of breath in the setting of recent positive home covid test. The patient states she tested positive five days ago. The shortness of breath started a couple of days ago. Has been getting progressively worse. The patient denies any preexisting lung disease. Patient was given breathing treatments and solumedrol by EMS. Was placed on NRB.   Records reviewed. Per medical record review patient has a history of possible chronic bronchitis per note from [facility].   There are no problems to display for this patient.   History reviewed. No pertinent surgical history.  Prior to Admission medications   Medication Sig Start Date End Date Taking? Authorizing Provider  meloxicam (MOBIC) 15 MG tablet Take 1 tablet (15 mg total) by mouth daily. 05/01/18   Chinita Pester, FNP    Allergies Patient has no known allergies.  No family history on file.  Social History Social History   Tobacco Use  . Smoking status: Current Every Day Smoker  . Smokeless tobacco: Never Used  Vaping Use  . Vaping Use: Never used  Substance Use Topics  . Alcohol use: Yes    Comment: patient states that she drinks 15 12oz beers daily  . Drug use: Yes    Types: Marijuana    Review of Systems Constitutional: Positive for fever.  Eyes: No visual changes. ENT: No sore throat. Cardiovascular: Positive for chest pain. Respiratory: Positive for shortness of breath. Gastrointestinal: No abdominal pain. Positive for nausea.  Genitourinary: Negative for dysuria. Musculoskeletal: Negative for back pain. Skin: Negative for  rash. Neurological: Negative for headaches, focal weakness or numbness.  ____________________________________________   PHYSICAL EXAM:  VITAL SIGNS: ED Triage Vitals  Enc Vitals Group     BP 08/26/20 1531 (!) 153/92     Pulse Rate 08/26/20 1531 (!) 160     Resp 08/26/20 1531 (!) 40     Temp --      Temp src --      SpO2 08/26/20 1531 97 %     Weight 08/26/20 1534 125 lb (56.7 kg)     Height 08/26/20 1534 5\' 6"  (1.676 m)     Head Circumference --      Peak Flow --      Pain Score 08/26/20 1532 8   Constitutional: Alert and oriented.  Eyes: Conjunctivae are normal.  ENT      Head: Normocephalic and atraumatic.      Nose: No congestion/rhinnorhea.      Mouth/Throat: Mucous membranes are moist.      Neck: No stridor. Hematological/Lymphatic/Immunilogical: No cervical lymphadenopathy. Cardiovascular: Tachycardic, regular rhythm.  No murmurs, rubs, or gallops.  Respiratory: Increased respiratory effort. Tachypnea. Diffuse rhonchi.  Gastrointestinal: Soft and non tender. No rebound. No guarding.  Genitourinary: Deferred Musculoskeletal: Normal range of motion in all extremities. No lower extremity edema. Neurologic:  Normal speech and language. No gross focal neurologic deficits are appreciated.  Skin:  Skin is warm, dry and intact. No rash noted. Psychiatric: Mood and affect are normal. Speech and behavior are normal. Patient exhibits appropriate insight and judgment.  ____________________________________________    LABS (pertinent positives/negatives)  CMP na 134, k 3.7, glu  142, cr 0.53 CBC wbc 12.7, hgb 18.1, plt 187 COVID negative x 2 Lactic acid 3.5 to 4.3 ____________________________________________   EKG  I, Phineas Semen, attending physician, personally viewed and interpreted this EKG  EKG Time: 1558 Rate: 155 Rhythm: sinus tachycardia Axis: normal Intervals: qtc 432 QRS: LVH ST changes: no st elevation Impression: abnormal  ekg   ____________________________________________    RADIOLOGY  CXR Diffuse interstitial opacities  CT angio No PE. Patchy consolidations throughout both lungs. Additional micronodular miliary opacities  ____________________________________________   PROCEDURES  Procedures  CRITICAL CARE Performed by: Phineas Semen   Total critical care time: 45 minutes  Critical care time was exclusive of separately billable procedures and treating other patients.  Critical care was necessary to treat or prevent imminent or life-threatening deterioration.  Critical care was time spent personally by me on the following activities: development of treatment plan with patient and/or surrogate as well as nursing, discussions with consultants, evaluation of patient's response to treatment, examination of patient, obtaining history from patient or surrogate, ordering and performing treatments and interventions, ordering and review of laboratory studies, ordering and review of radiographic studies, pulse oximetry and re-evaluation of patient's condition.  ____________________________________________   INITIAL IMPRESSION / ASSESSMENT AND PLAN / ED COURSE  Pertinent labs & imaging results that were available during my care of the patient were reviewed by me and considered in my medical decision making (see chart for details).   Patient presented to the emergency department today because of concerns for worsening shortness of breath over the past few days.  Patient stated that she had at home positive COVID test roughly 5 days ago.  On initial exam patient did exhibit diffuse rhonchi and significantly increased work of breathing.  She was brought initially on nonrebreather.  Did want to try BiPAP however patient was not tolerating the mask.  Did try some Ativan to help calm her down however patient continued to not tolerate it.  Patient's work-up here is concerning for some type of lung infection.   She did test negative for COVID twice year.  I did obtain a CT scan to make sure there was no pulmonary embolism and to get better evaluation of the patient's lungs.  This shows concerning opacities throughout both lungs.  Patient was started on IV antibiotics.  Patient was able to be weaned down to 6 L of nasal cannula while here in the emergency department.  Will admit to the hospital service.  ____________________________________________   FINAL CLINICAL IMPRESSION(S) / ED DIAGNOSES  Final diagnoses:  Shortness of breath  Hypoxia  Pneumonia of both lungs due to infectious organism, unspecified part of lung     Note: This dictation was prepared with Dragon dictation. Any transcriptional errors that result from this process are unintentional     Phineas Semen, MD 08/26/20 2140

## 2020-08-26 NOTE — Progress Notes (Signed)
Attempted to place BIPAP mask on patient @ 1540 and patient stated she was going to throw up as soon as mask touched her face.  BIPAP mask taken away from patient face and placed back on 100% non rebreather mask. MD aware that patient did not tolerate at this time.  RN will call this writer at later time when it is felt the patient may tolerate BIPAP mask.

## 2020-08-27 LAB — HIV ANTIBODY (ROUTINE TESTING W REFLEX): HIV Screen 4th Generation wRfx: NONREACTIVE

## 2020-08-27 LAB — EXPECTORATED SPUTUM ASSESSMENT W GRAM STAIN, RFLX TO RESP C

## 2020-08-27 LAB — C-REACTIVE PROTEIN: CRP: 33.3 mg/dL — ABNORMAL HIGH (ref <1.0)

## 2020-08-27 LAB — LACTATE DEHYDROGENASE: LDH: 115 U/L (ref 98–192)

## 2020-08-27 LAB — STREP PNEUMONIAE URINARY ANTIGEN: Strep Pneumo Urinary Antigen: NEGATIVE

## 2020-08-27 LAB — PROCALCITONIN: Procalcitonin: 0.57 ng/mL

## 2020-08-27 LAB — FERRITIN: Ferritin: 327 ng/mL — ABNORMAL HIGH (ref 11–307)

## 2020-08-27 NOTE — Discharge Summary (Signed)
RN called to bedside.  Patient has removed all of the monitoring equipment and is attempting to turn off IV.  Patient is adamant about leaving the hospital.  This RN educated patient on the risks of leaving in her condition.  Her mother is at bedside sobbing while I go over the risks with her.  Patient will not listen to her mother to get her to stay.    RN offered to get her Ativan for her fear of DT and nicotine patch, but she declined.    This RN was very clear with patient that my biggest concern is that she will die without the oxygen that we were providing to her.  She stated "I don't care.  I'm going home.  You can take this IV out or I'm going to."  RN removed the IV.  This RN read the entire AMA form to her, emphasizing the risk of death after leaving and she signed willingly, in front of her mother.    I was made aware about this and as I was planning to come to the emergency room the patient refused to even wait for me to talk to her and she walked out of the ER knowing that she may die tonight.

## 2020-08-27 NOTE — ED Notes (Addendum)
RN called to bedside.  Patient has removed all of the monitoring equipment and is attempting to turn off IV.  Patient is adamant about leaving the hospital.  This RN educated patient on the risks of leaving in her condition.  Her mother is at bedside sobbing while I go over the risks with her.  Patient will not listen to her mother to get her to stay.    RN offered to get her Ativan for her fear of DT and nicotine patch, but she declined.    This RN was very clear with patient that my biggest concern is that she will die without the oxygen that we were providing to her.  She stated "I don't care.  I'm going home.  You can take this IV out or I'm going to."  RN removed the IV.  This RN read the entire AMA form to her, emphasizing the risk of death after leaving and she signed willingly, in front of her mother.    Dr. Arville Care made aware.

## 2020-08-28 LAB — LEGIONELLA PNEUMOPHILA SEROGP 1 UR AG: L. pneumophila Serogp 1 Ur Ag: NEGATIVE

## 2020-08-29 LAB — CULTURE, RESPIRATORY W GRAM STAIN: Culture: NORMAL

## 2020-08-29 LAB — MYCOPLASMA PNEUMONIAE ANTIBODY, IGM: Mycoplasma pneumo IgM: <770 U/mL (ref 0–769)

## 2020-08-31 LAB — CULTURE, BLOOD (ROUTINE X 2)
Culture: NO GROWTH
Culture: NO GROWTH
Special Requests: ADEQUATE

## 2020-09-07 ENCOUNTER — Emergency Department
Admission: EM | Admit: 2020-09-07 | Discharge: 2020-09-07 | DRG: 871 | Discharge status: Against Medical Advice | Payer: Self-pay | Attending: Internal Medicine | Admitting: Internal Medicine

## 2020-09-07 ENCOUNTER — Other Ambulatory Visit: Payer: Self-pay

## 2020-09-07 ENCOUNTER — Emergency Department: Payer: Self-pay

## 2020-09-07 DIAGNOSIS — J9600 Acute respiratory failure, unspecified whether with hypoxia or hypercapnia: Secondary | ICD-10-CM | POA: Diagnosis present

## 2020-09-07 DIAGNOSIS — F17213 Nicotine dependence, cigarettes, with withdrawal: Secondary | ICD-10-CM

## 2020-09-07 DIAGNOSIS — R112 Nausea with vomiting, unspecified: Secondary | ICD-10-CM | POA: Diagnosis present

## 2020-09-07 DIAGNOSIS — K219 Gastro-esophageal reflux disease without esophagitis: Secondary | ICD-10-CM | POA: Diagnosis present

## 2020-09-07 DIAGNOSIS — Z5329 Procedure and treatment not carried out because of patient's decision for other reasons: Secondary | ICD-10-CM | POA: Diagnosis not present

## 2020-09-07 DIAGNOSIS — F17210 Nicotine dependence, cigarettes, uncomplicated: Secondary | ICD-10-CM | POA: Diagnosis present

## 2020-09-07 DIAGNOSIS — F17200 Nicotine dependence, unspecified, uncomplicated: Secondary | ICD-10-CM | POA: Diagnosis present

## 2020-09-07 DIAGNOSIS — R636 Underweight: Secondary | ICD-10-CM | POA: Diagnosis present

## 2020-09-07 DIAGNOSIS — J9601 Acute respiratory failure with hypoxia: Secondary | ICD-10-CM | POA: Diagnosis present

## 2020-09-07 DIAGNOSIS — Z20822 Contact with and (suspected) exposure to covid-19: Secondary | ICD-10-CM | POA: Diagnosis present

## 2020-09-07 DIAGNOSIS — F1010 Alcohol abuse, uncomplicated: Secondary | ICD-10-CM | POA: Diagnosis present

## 2020-09-07 DIAGNOSIS — J189 Pneumonia, unspecified organism: Secondary | ICD-10-CM | POA: Diagnosis present

## 2020-09-07 DIAGNOSIS — R6520 Severe sepsis without septic shock: Secondary | ICD-10-CM | POA: Diagnosis present

## 2020-09-07 DIAGNOSIS — Z8249 Family history of ischemic heart disease and other diseases of the circulatory system: Secondary | ICD-10-CM

## 2020-09-07 DIAGNOSIS — Z803 Family history of malignant neoplasm of breast: Secondary | ICD-10-CM

## 2020-09-07 DIAGNOSIS — A419 Sepsis, unspecified organism: Secondary | ICD-10-CM | POA: Diagnosis present

## 2020-09-07 LAB — CBC WITH DIFFERENTIAL/PLATELET
Abs Immature Granulocytes: 0.1 10*3/uL — ABNORMAL HIGH (ref 0.00–0.07)
Basophils Absolute: 0.1 10*3/uL (ref 0.0–0.1)
Basophils Relative: 1 %
Eosinophils Absolute: 0.2 10*3/uL (ref 0.0–0.5)
Eosinophils Relative: 1 %
HCT: 44.1 % (ref 36.0–46.0)
Hemoglobin: 15.4 g/dL — ABNORMAL HIGH (ref 12.0–15.0)
Immature Granulocytes: 1 %
Lymphocytes Relative: 18 %
Lymphs Abs: 3.2 10*3/uL (ref 0.7–4.0)
MCH: 37.2 pg — ABNORMAL HIGH (ref 26.0–34.0)
MCHC: 34.9 g/dL (ref 30.0–36.0)
MCV: 106.5 fL — ABNORMAL HIGH (ref 80.0–100.0)
Monocytes Absolute: 0.7 10*3/uL (ref 0.1–1.0)
Monocytes Relative: 4 %
Neutro Abs: 13.2 10*3/uL — ABNORMAL HIGH (ref 1.7–7.7)
Neutrophils Relative %: 75 %
Platelets: 331 10*3/uL (ref 150–400)
RBC: 4.14 MIL/uL (ref 3.87–5.11)
RDW: 12.1 % (ref 11.5–15.5)
WBC: 17.5 10*3/uL — ABNORMAL HIGH (ref 4.0–10.5)
nRBC: 0 % (ref 0.0–0.2)

## 2020-09-07 LAB — COMPREHENSIVE METABOLIC PANEL
ALT: 21 U/L (ref 0–44)
AST: 36 U/L (ref 15–41)
Albumin: 3.6 g/dL (ref 3.5–5.0)
Alkaline Phosphatase: 64 U/L (ref 38–126)
Anion gap: 15 (ref 5–15)
BUN: <5 mg/dL — ABNORMAL LOW (ref 6–20)
CO2: 19 mmol/L — ABNORMAL LOW (ref 22–32)
Calcium: 8.9 mg/dL (ref 8.9–10.3)
Chloride: 101 mmol/L (ref 98–111)
Creatinine, Ser: 0.34 mg/dL — ABNORMAL LOW (ref 0.44–1.00)
GFR, Estimated: >60 mL/min (ref >60)
Glucose, Bld: 103 mg/dL — ABNORMAL HIGH (ref 70–99)
Potassium: 3.3 mmol/L — ABNORMAL LOW (ref 3.5–5.1)
Sodium: 135 mmol/L (ref 135–145)
Total Bilirubin: 0.7 mg/dL (ref 0.3–1.2)
Total Protein: 8.9 g/dL — ABNORMAL HIGH (ref 6.5–8.1)

## 2020-09-07 LAB — D-DIMER, QUANTITATIVE: D-Dimer, Quant: 0.78 ug/mL-FEU — ABNORMAL HIGH (ref 0.00–0.50)

## 2020-09-07 LAB — RESP PANEL BY RT-PCR (FLU A&B, COVID) ARPGX2
Influenza A by PCR: NEGATIVE
Influenza B by PCR: NEGATIVE
SARS Coronavirus 2 by RT PCR: NEGATIVE

## 2020-09-07 LAB — TROPONIN I (HIGH SENSITIVITY): Troponin I (High Sensitivity): 4 ng/L (ref <18)

## 2020-09-07 LAB — LACTIC ACID, PLASMA: Lactic Acid, Venous: 3 mmol/L (ref 0.5–1.9)

## 2020-09-07 MED ORDER — POTASSIUM CHLORIDE CRYS ER 20 MEQ PO TBCR: 40.0000 meq | EXTENDED_RELEASE_TABLET | Freq: Once | ORAL | Status: DC

## 2020-09-07 MED ORDER — THIAMINE HCL 100 MG PO TABS
100.0000 mg | ORAL_TABLET | Freq: Every day | ORAL | Status: DC
  Filled 2020-09-07: qty 1

## 2020-09-07 MED ORDER — NICOTINE 21 MG/24HR TD PT24
21.0000 mg | MEDICATED_PATCH | Freq: Every day | TRANSDERMAL | Status: DC
  Filled 2020-09-07: qty 1

## 2020-09-07 MED ORDER — ENOXAPARIN SODIUM 40 MG/0.4ML IJ SOSY
40.0000 mg | PREFILLED_SYRINGE | INTRAMUSCULAR | Status: DC
  Filled 2020-09-07: qty 0.4

## 2020-09-07 MED ORDER — LORAZEPAM 2 MG PO TABS: 0.0000 mg | ORAL_TABLET | Freq: Four times a day (QID) | ORAL | Status: DC

## 2020-09-07 MED ORDER — ADULT MULTIVITAMIN W/MINERALS CH: 1.0000 | ORAL_TABLET | Freq: Every day | ORAL | Status: DC

## 2020-09-07 MED ORDER — LACTATED RINGERS IV SOLN: INTRAVENOUS | Status: DC

## 2020-09-07 MED ORDER — VANCOMYCIN HCL IN DEXTROSE 1-5 GM/200ML-% IV SOLN
1000.0000 mg | Freq: Once | INTRAVENOUS | Status: AC
  Administered 2020-09-07: 1000 mg via INTRAVENOUS
  Filled 2020-09-07: qty 200

## 2020-09-07 MED ORDER — THIAMINE HCL 100 MG/ML IJ SOLN
Freq: Once | INTRAVENOUS | Status: DC
  Filled 2020-09-07: qty 1000

## 2020-09-07 MED ORDER — LORAZEPAM 2 MG/ML IJ SOLN: 1.0000 mg | INTRAMUSCULAR | Status: DC | PRN

## 2020-09-07 MED ORDER — LORAZEPAM 2 MG PO TABS: 0.0000 mg | ORAL_TABLET | Freq: Two times a day (BID) | ORAL | Status: DC

## 2020-09-07 MED ORDER — SODIUM CHLORIDE 0.9 % IV SOLN
2.0000 g | Freq: Once | INTRAVENOUS | Status: AC
  Administered 2020-09-07: 2 g via INTRAVENOUS
  Filled 2020-09-07: qty 2

## 2020-09-07 MED ORDER — THIAMINE HCL 100 MG/ML IJ SOLN
100.0000 mg | Freq: Every day | INTRAMUSCULAR | Status: DC
  Filled 2020-09-07: qty 2

## 2020-09-07 MED ORDER — SODIUM CHLORIDE 0.9 % IV BOLUS
1000.0000 mL | Freq: Once | INTRAVENOUS | Status: AC
Start: 2020-09-07 — End: 2020-09-07
  Administered 2020-09-07: 1000 mL via INTRAVENOUS

## 2020-09-07 MED ORDER — SODIUM CHLORIDE 0.9 % IV SOLN: 500.0000 mg | INTRAVENOUS | Status: DC

## 2020-09-07 MED ORDER — SODIUM CHLORIDE 0.9 % IV BOLUS (SEPSIS)
1000.0000 mL | Freq: Once | INTRAVENOUS | Status: AC
  Administered 2020-09-07: 1000 mL via INTRAVENOUS

## 2020-09-07 MED ORDER — IOHEXOL 350 MG/ML SOLN
75.0000 mL | Freq: Once | INTRAVENOUS | Status: AC | PRN
  Administered 2020-09-07: 75 mL via INTRAVENOUS

## 2020-09-07 MED ORDER — FOLIC ACID 1 MG PO TABS: 1.0000 mg | ORAL_TABLET | Freq: Every day | ORAL | Status: DC

## 2020-09-07 MED ORDER — LORAZEPAM 2 MG/ML IJ SOLN
1.0000 mg | Freq: Once | INTRAMUSCULAR | Status: AC
  Administered 2020-09-07: 1 mg via INTRAVENOUS
  Filled 2020-09-07: qty 1

## 2020-09-07 MED ORDER — LORAZEPAM 1 MG PO TABS: 1.0000 mg | ORAL_TABLET | ORAL | Status: DC | PRN

## 2020-09-07 MED ORDER — SODIUM CHLORIDE 0.9 % IV SOLN
2.0000 g | INTRAVENOUS | Status: DC
  Filled 2020-09-07: qty 20

## 2020-09-07 MED ORDER — KETOROLAC TROMETHAMINE 30 MG/ML IJ SOLN
15.0000 mg | INTRAMUSCULAR | Status: AC
  Administered 2020-09-07: 15 mg via INTRAVENOUS
  Filled 2020-09-07: qty 1

## 2020-09-07 NOTE — ED Notes (Signed)
Admitting at bedside. Received report from [place], [place]

## 2020-09-07 NOTE — Progress Notes (Signed)
Received call from charge nurse that patient wants to leave AGAINST MEDICAL ADVICE. Called and spoke to patient in detail about risks which include worsening respiratory failure, worsening sepsis with risk of death. Patient verbalizes understanding and states that she was told the same the last time she was in the hospital.

## 2020-09-07 NOTE — ED Provider Notes (Signed)
[facility] Emergency Department Provider Note  ____________________________________________  Time seen: Approximately 4:03 PM  I have reviewed the triage vital signs and the nursing notes.   HISTORY  Chief Complaint Respiratory Distress    HPI Kelly Patterson is a 35 y.o. female with a past history of smoking who comes ED complaining of acute onset of shortness of breath  that started at 2:15 PM today, lasted about an hour, and now feels much better.  States that she thinks she got anxious.  She was feeling stressed and anxious while getting ready for work at the time.  Associated with chest pain.  Daughter provides additional history that her mom struggles with alcohol abuse as well.  Patient reports the chest pain is described as tightness, moderate intensity, constant, nonradiating, no aggravating or alleviating factors, not pleuritic.  She has had productive cough for the past 3 weeks.  She is completed antibiotics for this but not feeling much better.  Still smoking.  Denies any recent travel or trauma.  Was hospitalized for the pneumonia 2 weeks ago briefly.  No recent surgeries, no history of DVT or PE.    History reviewed. No pertinent past medical history.   Patient Active Problem List   Diagnosis Date Noted  . Acute respiratory failure (HCC) 08/26/2020     History reviewed. No pertinent surgical history.   Prior to Admission medications   Not on File     Allergies Patient has no known allergies.   No family history on file.  Social History Social History   Tobacco Use  . Smoking status: Every Day    Pack years: 0.00  . Smokeless tobacco: Never  Vaping Use  . Vaping Use: Never used  Substance Use Topics  . Alcohol use: Yes    Comment: patient states that she drinks 15 12oz beers daily  . Drug use: Yes    Types: Marijuana    Review of Systems  Constitutional:   No fever or chills.  ENT:   No sore throat. No  rhinorrhea. Cardiovascular:   Positive chest tightness as above without syncope. Respiratory:   Positive shortness of breath and cough. Gastrointestinal:   Negative for abdominal pain, positive nausea and vomiting occasionally.  Musculoskeletal:   Negative for focal pain or swelling All other systems reviewed and are negative except as documented above in ROS and HPI.  ____________________________________________   PHYSICAL EXAM:  VITAL SIGNS: ED Triage Vitals  Enc Vitals Group     BP 09/07/20 1524 118/72     Pulse Rate 09/07/20 1524 (!) 121     Resp 09/07/20 1524 (!) 26     Temp 09/07/20 1524 99.1 F (37.3 C)     Temp Source 09/07/20 1524 Oral     SpO2 09/07/20 1524 97 %     Weight 09/07/20 1525 130 lb (59 kg)     Height 09/07/20 1525 5\' 6"  (1.676 m)     Head Circumference --      Peak Flow --      Pain Score 09/07/20 1525 0     Pain Loc --      Pain Edu? --      Excl. in GC? --     Vital signs reviewed, nursing assessments reviewed. Oxygen saturation 90% on room air  Constitutional:   Alert and oriented. Non-toxic appearance. Eyes:   Conjunctivae are normal. EOMI. PERRL. ENT      Head:   Normocephalic and atraumatic.  Nose:   Normal .      Mouth/Throat: Dry mucous membranes .      Neck:   No meningismus. Full ROM. Hematological/Lymphatic/Immunilogical:   No cervical lymphadenopathy. Cardiovascular:   Tachycardia heart rate 130. Symmetric bilateral radial and DP pulses.  No murmurs. Cap refill less than 2 seconds. Respiratory:   Normal respiratory effort without tachypnea/retractions. Breath sounds are clear and equal bilaterally. No wheezes/rales/rhonchi.  No inducible wheezing or cough with FEV1 maneuver.  Normal expiratory phase. Gastrointestinal:   Soft and nontender. Non distended. There is no CVA tenderness.  No rebound, rigidity, or guarding. Genitourinary:   deferred Musculoskeletal:   Normal range of motion in all extremities. No joint effusions.  No lower  extremity tenderness.  No edema. Neurologic:   Normal speech and language.  Motor grossly intact. No acute focal neurologic deficits are appreciated.  Skin:    Skin is warm, dry and intact. No rash noted.  No petechiae, purpura, or bullae.  ____________________________________________    LABS (pertinent positives/negatives) (all labs ordered are listed, but only abnormal results are displayed) Labs Reviewed  CBC WITH DIFFERENTIAL/PLATELET - Abnormal; Notable for the following components:      Result Value   WBC 17.5 (*)    Hemoglobin 15.4 (*)    MCV 106.5 (*)    MCH 37.2 (*)    Neutro Abs 13.2 (*)    Abs Immature Granulocytes 0.10 (*)    All other components within normal limits  COMPREHENSIVE METABOLIC PANEL - Abnormal; Notable for the following components:   Potassium 3.3 (*)    CO2 19 (*)    Glucose, Bld 103 (*)    BUN <5 (*)    Creatinine, Ser 0.34 (*)    Total Protein 8.9 (*)    All other components within normal limits  D-DIMER, QUANTITATIVE - Abnormal; Notable for the following components:   D-Dimer, Quant 0.78 (*)    All other components within normal limits  RESP PANEL BY RT-PCR (FLU A&B, COVID) ARPGX2  CULTURE, BLOOD (ROUTINE X 2)  CULTURE, BLOOD (ROUTINE X 2)  CULTURE, BLOOD (SINGLE)  LACTIC ACID, PLASMA  LACTIC ACID, PLASMA  TROPONIN I (HIGH SENSITIVITY)   ____________________________________________   EKG Interpreted by me Sinus tachycardia rate 131.  Normal axis and intervals.  Poor R wave progression.  Normal ST segments and T waves.  No ischemic changes   ____________________________________________    RADIOLOGY  CT Angio Chest PE W and/or Wo Contrast  Result Date: 09/07/2020 CLINICAL DATA:  Positive D-dimer. Shortness of breath. Productive cough for 3 weeks. Pulmonary embolus is suspected with low to intermediate probability. EXAM: CT ANGIOGRAPHY CHEST WITH CONTRAST TECHNIQUE: Multidetector CT imaging of the chest was performed using the  standard protocol during bolus administration of intravenous contrast. Multiplanar CT image reconstructions and MIPs were obtained to evaluate the vascular anatomy. CONTRAST:  79mL OMNIPAQUE IOHEXOL 350 MG/ML SOLN COMPARISON:  08/26/2020 FINDINGS: Cardiovascular: There is good opacification of the central and segmental pulmonary arteries. No focal filling defects. No evidence of significant pulmonary embolus. Normal heart size. No pericardial effusions. Normal caliber thoracic aorta. No aortic dissection. Mediastinum/Nodes: Esophagus is gas filled with mild dilatation, likely due to reflux or dysmotility. Mediastinal lymph nodes are mildly prominent without pathologic enlargement, likely reactive. Lungs/Pleura: Since the previous study, the multifocal infiltrates have improved although still present. There is interval development of dense consolidation in the right middle lung suggesting developing lobar pneumonia or endobronchial obstruction. No pleural effusions. Upper Abdomen: Diffuse  fatty infiltration of the liver. Musculoskeletal: Old rib fractures.  No destructive bone lesions. Review of the MIP images confirms the above findings. IMPRESSION: 1. No evidence of significant pulmonary embolus. 2. Persistent but improving patchy multifocal infiltrates in the lungs. 3. Interval development of collapse and consolidation of the right middle lung suggesting developing lobar pneumonia or endobronchial obstruction. 4. Fatty infiltration of the liver. Electronically Signed   By: Burman NievesWilliam  Stevens M.D.   On: 09/07/2020 17:12   DG Chest Portable 1 View  Result Date: 09/07/2020 CLINICAL DATA:  Complains of shortness of breath and chest pain. EXAM: PORTABLE CHEST 1 VIEW COMPARISON:  CT angio chest 08/26/2020. FINDINGS: Normal heart size. No pleural effusion or edema. The lungs are hyperinflated. Interval improvement in bilateral interstitial and airspace opacities noted on the previous exam. Persistent and progressive  right lower perihilar airspace opacity. IMPRESSION: Overall, there has been an interval improvement in bilateral lower lobe interstitial and airspace opacities. There is a persistent and progressive right lower perihilar airspace opacity compatible concerning for residual inflammation/infection. Electronically Signed   By: Signa Kellaylor  Stroud M.D.   On: 09/07/2020 16:32    ____________________________________________   PROCEDURES .Critical Care  Date/Time: 09/07/2020 7:14 PM Performed by: Sharman CheekStafford, Duyen Beckom, MD Authorized by: Sharman CheekStafford, Roy Tokarz, MD   Critical care provider statement:    Critical care time (minutes):  35   Critical care time was exclusive of:  Separately billable procedures and treating other patients   Critical care was necessary to treat or prevent imminent or life-threatening deterioration of the following conditions:  Respiratory failure and sepsis   Critical care was time spent personally by me on the following activities:  Development of treatment plan with patient or surrogate, discussions with consultants, evaluation of patient's response to treatment, examination of patient, obtaining history from patient or surrogate, ordering and performing treatments and interventions, ordering and review of laboratory studies, ordering and review of radiographic studies, pulse oximetry, re-evaluation of patient's condition and review of old charts  ____________________________________________  DIFFERENTIAL DIAGNOSIS   Anxiety, alcohol withdrawal, dehydration, electrolyte abnormality, pulmonary embolism, pneumonia, pneumothorax, COVID, flu  CLINICAL IMPRESSION / ASSESSMENT AND PLAN / ED COURSE  Medications ordered in the ED: Medications  sodium chloride 0.9 % bolus 1,000 mL (1,000 mLs Intravenous New Bag/Given 09/07/20 1826)  vancomycin (VANCOCIN) IVPB 1000 mg/200 mL premix (1,000 mg Intravenous New Bag/Given 09/07/20 1852)  sodium chloride 0.9 % bolus 1,000 mL (1,000 mLs Intravenous  Bolus 09/07/20 1615)  ketorolac (TORADOL) 30 MG/ML injection 15 mg (15 mg Intravenous Given 09/07/20 1616)  LORazepam (ATIVAN) injection 1 mg (1 mg Intravenous Given 09/07/20 1616)  iohexol (OMNIPAQUE) 350 MG/ML injection 75 mL (75 mLs Intravenous Contrast Given 09/07/20 1647)  ceFEPIme (MAXIPIME) 2 g in sodium chloride 0.9 % 100 mL IVPB (0 g Intravenous Stopped 09/07/20 1900)    Pertinent labs & imaging results that were available during my care of the patient were reviewed by me and considered in my medical decision making (see chart for details).  Hali L Katrinka BlazingSmith was evaluated in Emergency Department on 09/07/2020 for the symptoms described in the history of present illness. She was evaluated in the context of the global COVID-19 pandemic, which necessitated consideration that the patient might be at risk for infection with the SARS-CoV-2 virus that causes COVID-19. Institutional protocols and algorithms that pertain to the evaluation of patients at risk for COVID-19 are in a state of rapid change based on information released by regulatory bodies including the CDC and federal  and state organizations. These policies and algorithms were followed during the patient's care in the ED.   Patient presents with shortness of breath, chest tightness, tachycardia.  Will give IV fluids, check labs and chest x-ray.  Recheck COVID and flu which were negative about a week ago.  Clinical Course as of 09/07/20 2005  Thu Sep 07, 2020  1618 Chest x-ray viewed and interpreted by me, shows hyperinflation consistent with underlying COPD.  No pneumothorax edema effusion or infectious infiltrate.  Bones unremarkable. [PS]  1631 D-dimer positive.  Will order CT angiogram of the chest [PS]  1807 CT angiogram negative for PE but does show right middle lobe consolidation which appears to be new lobar pneumonia.  Additionally, patient's most recent blood pressure is a map of 63.  She remains tachycardic, tachypneic now concerning  for sepsis with  demonstrated a pulmonary source.  Will check lactate and cultures.  We will treat as HCAP due to her recent antibiotic use with cefepime and vancomycin.  We will need to admit for hypoxic respiratory failure. [PS]  2004 Sepsis reassessment completed.  Patient has completed 2 L of IV fluid bolus, greater than 30 mL/kg.  Blood pressure is normal.  Vasopressors not indicated. [PS]    Clinical Course User Index [PS] Sharman Cheek, MD     ____________________________________________   FINAL CLINICAL IMPRESSION(S) / ED DIAGNOSES    Final diagnoses:  HCAP (healthcare-associated pneumonia)  Sepsis with acute hypoxic respiratory failure without septic shock, due to unspecified organism [facility])     ED Discharge Orders     None       Portions of this note were generated with dragon dictation software. Dictation errors may occur despite best attempts at proofreading.   Sharman Cheek, MD 09/07/20 [phone]

## 2020-09-07 NOTE — ED Notes (Signed)
Lab at bedside obtaining BC and Lactic acid

## 2020-09-07 NOTE — ED Notes (Signed)
ED Provider at bedside. 

## 2020-09-07 NOTE — H&P (Signed)
History and Physical    ESTERLENE ATIYEH GYB:[id number] DOB: 1985-09-26 DOA: 09/07/2020  PCP: Patient, No Pcp Per (Inactive)   Patient coming from: Home  I have personally briefly reviewed patient's old medical records in [facility]  Chief Complaint: " I could not breathe"  HPI: AALEEYAH BIAS is a 35 y.o. female with medical history significant for alcohol abuse and nicotine dependence who presents to the emergency room with complaints of shortness of breath. Patient states that she has had shortness of breath with exertion for weeks which initially improved but got worse on the day of admission.  Shortness of breath is associated with a cough productive of greenish phlegm.  She has had intermittent fever and chills as well as nausea and vomiting.  She also complains of chest pressure as well as pleuritic chest pain. She was noted to have room air pulse oximetry of 90% with EMS and was placed on 4 L of oxygen with improvement in her pulse oximetry to 92%. She denies having any abdominal pain, no urinary symptoms, no headache, no palpitations, no diaphoresis, no dizziness, no lightheadedness. Patient was seen in the emergency room on 08/26/20 and at that time had acute respiratory failure requiring high flow nasal cannula.  She said she had a home positive COVID-19 test 5 days prior to her presentation and imaging was suggestive of multifocal pneumonia rule out viral pneumonia.  Patient's SARS coronavirus 2 point-of-care test done on that day was negative.  She signed out of the hospital AGAINST MEDICAL ADVICE and has not had any treatment until she presented to the ER today for evaluation of shortness of breath. Arterial blood gas 7.48/33/68/24.6/94.6 Labs show sodium 135, potassium 3.3, chloride 101, bicarb 19, glucose 103, BUN 5, creatinine 0.34, calcium 8.9, alkaline phosphatase 64, albumin 3.6, AST 36, ALT 21, total protein 8.9, LDH 115, troponin 4, white count 17.5, hemoglobin 15.4,  hematocrit 44.1, MCV 106.5, RDW 12.1, D-dimer 0.78  Respiratory viral panel is negative Chest x-ray reviewed by me shows overall, there has been an interval improvement in bilateral lower lobe interstitial and airspace opacities. There is a persistent and progressive right lower perihilar airspace opacity compatible concerning for residual inflammation/infection.   CT angiogram of the chest shows no evidence of significant pulmonary embolus. Esophagus is gas filled with mild dilatation, likely due to reflux or dysmotility. Mediastinal lymph nodes are mildly prominent without pathologic enlargement, likely reactive. Persistent but improving patchy multifocal infiltrates in the lungs. Interval development of collapse and consolidation of the right middle lung suggesting developing lobar pneumonia or endobronchial obstruction. Fatty infiltration of the liver. Twelve-lead EKG reviewed by me shows sinus tachycardia with PACs  ED Course: Patient is a 35 year old female who presents to the emergency room for evaluation of shortness of breath, cough productive of greenish phlegm, intermittent fever and chills. She had room air pulse oximetry of 90% and was placed on 4 L of oxygen by EMS.  She received bronchodilator therapy as well as Solu-Medrol 125 mg. She is tachycardic, tachypneic, has marked leukocytosis and imaging shows a right middle lobe pneumonia.  Lactic acid is pending. She received a dose of IV cefepime in the ER. She will be admitted to the hospital for further evaluation.     Review of Systems: As per HPI otherwise all other systems reviewed and negative.      reports that she has been smoking cigarettes. She has been smoking an average of 1.50 packs per day. She has never  used smokeless tobacco. She reports current alcohol use of about 12.0 standard drinks of alcohol per week. She reports current drug use. Drug: Marijuana.  No Known Allergies  Family History  Problem Relation Age  of Onset   Hypertension Mother    Breast cancer Other       Prior to Admission medications   Not on File    Physical Exam: Vitals:   09/07/20 1715 09/07/20 1800 09/07/20 1830 09/07/20 1900  BP: 97/60 (!) 96/49 95/62 117/79  Pulse: (!) 118 (!) 120 (!) 111 (!) 117  Resp: (!) 30 (!) 40 (!) 23 (!) 31  Temp:      TempSrc:      SpO2: 93% (!) 87% 99% 96%  Weight:      Height:         Vitals:   09/07/20 1715 09/07/20 1800 09/07/20 1830 09/07/20 1900  BP: 97/60 (!) 96/49 95/62 117/79  Pulse: (!) 118 (!) 120 (!) 111 (!) 117  Resp: (!) 30 (!) 40 (!) 23 (!) 31  Temp:      TempSrc:      SpO2: 93% (!) 87% 99% 96%  Weight:      Height:          Constitutional: Alert and oriented x 3 . Not in any apparent distress.  Acutely ill-appearing HEENT:      Head: Normocephalic and atraumatic.         Eyes: PERLA, EOMI, Conjunctivae are normal. Sclera is non-icteric.       Mouth/Throat: Mucous membranes are dry.       Neck: Supple with no signs of meningismus. Cardiovascular: Tachycardic. No murmurs, gallops, or rubs. 2+ symmetrical distal pulses are present . No JVD. No LE edema Respiratory: Tachypneic.scattered rhonchi  Rt > Lt . No wheezes or crackles.  Gastrointestinal: Soft, non tender, and non distended with positive bowel sounds.  Genitourinary: No CVA tenderness. Musculoskeletal: Nontender with normal range of motion in all extremities. No cyanosis, or erythema of extremities. Neurologic:  Face is symmetric. Moving all extremities. No gross focal neurologic deficits . Skin: Skin is warm, dry.  No rash or ulcers Psychiatric: Mood and affect are normal    Labs on Admission: I have personally reviewed following labs and imaging studies  CBC: Recent Labs  Lab 09/07/20 1543  WBC 17.5*  NEUTROABS 13.2*  HGB 15.4*  HCT 44.1  MCV 106.5*  PLT 331   Basic Metabolic Panel: Recent Labs  Lab 09/07/20 1543  NA 135  K 3.3*  CL 101  CO2 19*  GLUCOSE 103*  BUN <5*   CREATININE 0.34*  CALCIUM 8.9   GFR: Estimated Creatinine Clearance: 90.5 mL/min (A) (by C-G formula based on SCr of 0.34 mg/dL (L)). Liver Function Tests: Recent Labs  Lab 09/07/20 1543  AST 36  ALT 21  ALKPHOS 64  BILITOT 0.7  PROT 8.9*  ALBUMIN 3.6   No results for input(s): LIPASE, AMYLASE in the last 168 hours. No results for input(s): AMMONIA in the last 168 hours. Coagulation Profile: No results for input(s): INR, PROTIME in the last 168 hours. Cardiac Enzymes: No results for input(s): CKTOTAL, CKMB, CKMBINDEX, TROPONINI in the last 168 hours. BNP (last 3 results) No results for input(s): PROBNP in the last 8760 hours. HbA1C: No results for input(s): HGBA1C in the last 72 hours. CBG: No results for input(s): GLUCAP in the last 168 hours. Lipid Profile: No results for input(s): CHOL, HDL, LDLCALC, TRIG, CHOLHDL, LDLDIRECT in the last  72 hours. Thyroid Function Tests: No results for input(s): TSH, T4TOTAL, FREET4, T3FREE, THYROIDAB in the last 72 hours. Anemia Panel: No results for input(s): VITAMINB12, FOLATE, FERRITIN, TIBC, IRON, RETICCTPCT in the last 72 hours. Urine analysis: No results found for: COLORURINE, APPEARANCEUR, LABSPEC, PHURINE, GLUCOSEU, HGBUR, BILIRUBINUR, KETONESUR, PROTEINUR, UROBILINOGEN, NITRITE, LEUKOCYTESUR  Radiological Exams on Admission: CT Angio Chest PE W and/or Wo Contrast  Result Date: 09/07/2020 CLINICAL DATA:  Positive D-dimer. Shortness of breath. Productive cough for 3 weeks. Pulmonary embolus is suspected with low to intermediate probability. EXAM: CT ANGIOGRAPHY CHEST WITH CONTRAST TECHNIQUE: Multidetector CT imaging of the chest was performed using the standard protocol during bolus administration of intravenous contrast. Multiplanar CT image reconstructions and MIPs were obtained to evaluate the vascular anatomy. CONTRAST:  75mL OMNIPAQUE IOHEXOL 350 MG/ML SOLN COMPARISON:  08/26/2020 FINDINGS: Cardiovascular: There is good  opacification of the central and segmental pulmonary arteries. No focal filling defects. No evidence of significant pulmonary embolus. Normal heart size. No pericardial effusions. Normal caliber thoracic aorta. No aortic dissection. Mediastinum/Nodes: Esophagus is gas filled with mild dilatation, likely due to reflux or dysmotility. Mediastinal lymph nodes are mildly prominent without pathologic enlargement, likely reactive. Lungs/Pleura: Since the previous study, the multifocal infiltrates have improved although still present. There is interval development of dense consolidation in the right middle lung suggesting developing lobar pneumonia or endobronchial obstruction. No pleural effusions. Upper Abdomen: Diffuse fatty infiltration of the liver. Musculoskeletal: Old rib fractures.  No destructive bone lesions. Review of the MIP images confirms the above findings. IMPRESSION: 1. No evidence of significant pulmonary embolus. 2. Persistent but improving patchy multifocal infiltrates in the lungs. 3. Interval development of collapse and consolidation of the right middle lung suggesting developing lobar pneumonia or endobronchial obstruction. 4. Fatty infiltration of the liver. Electronically Signed   By: Burman NievesWilliam  Stevens M.D.   On: 09/07/2020 17:12   DG Chest Portable 1 View  Result Date: 09/07/2020 CLINICAL DATA:  Complains of shortness of breath and chest pain. EXAM: PORTABLE CHEST 1 VIEW COMPARISON:  CT angio chest 08/26/2020. FINDINGS: Normal heart size. No pleural effusion or edema. The lungs are hyperinflated. Interval improvement in bilateral interstitial and airspace opacities noted on the previous exam. Persistent and progressive right lower perihilar airspace opacity. IMPRESSION: Overall, there has been an interval improvement in bilateral lower lobe interstitial and airspace opacities. There is a persistent and progressive right lower perihilar airspace opacity compatible concerning for residual  inflammation/infection. Electronically Signed   By: Signa Kellaylor  Stroud M.D.   On: 09/07/2020 16:32     Assessment/Plan Principal Problem:   Sepsis (HCC) Active Problems:   Acute respiratory failure (HCC)   CAP (community acquired pneumonia)   Nicotine dependence   Alcohol abuse      Sepsis with acute respiratory failure (POA) As evidenced by tachycardia, tachypnea, marked leukocytosis, lactic acidosis and imaging suggestive of a right middle lobe pneumonia. She was hypoxic with room air pulse oximetry of 87% and is currently on 2 L of oxygen with improvement in her pulse oximetry to 94% Most likely community-acquired pneumonia to rule out aspiration versus postobstructive Patient received her sepsis IV fluid bolus in the ER Continue aggressive IV fluid resuscitation Place patient empirically on IV Rocephin and Zithromax Follow-up results of blood cultures We will request pulmonary consult for further evaluation with possible endobronchial obstruction Keep patient n.p.o. due to findings of gas-filled esophagus with mild dilatation, likely due to reflux or dysmotility.    Nicotine dependence Smoking cessation has  been discussed with patient in detail We will place patient on nicotine transdermal patch 21 mg daily    Alcohol abuse with high risk of developing alcohol withdrawal symptoms We will place patient on CIWA protocol and administer for CIWA score of 8 or greater.  DVT prophylaxis: Lovenox  Code Status: full code  Family Communication: Greater than 50% of time was spent discussing patient's condition and plan of care with her at the bedside.  All questions and concerns have been addressed.  She verbalizes understanding and agrees to the plan.   Disposition Plan: Back to previous home environment Consults called: Pulmonology Status: At the time of admission, it appears that the appropriate admission status for this patient is inpatient. This is judged to be reasonable and  necessary in order to provide the required intensity of service to ensure the patient's safety given the presenting symptoms, physical exam findings, and initial radiographic and laboratory data in the context of their comorbid conditions. Patient requires inpatient status due to high intensity of service, high risk of further deterioration and high frequency of surveillance required.    Lucile Shutters MD [facility]     09/07/2020, 7:58 PM

## 2020-09-07 NOTE — ED Notes (Signed)
Pt presents to ED with SOB that has been ongoing for a 1 week. Pt states recent COVID and has not had any improvement. Pt presents on 4L/min [place] and pt denies wearing home O2. Pt does have exp wheezes and also states she smokes 1.5 packs of cigarettes a day. Pt denies fever or chills. Pt is A&Ox4.

## 2020-09-07 NOTE — ED Notes (Signed)
Pt states she wants to leave, does not want to be admitted. Explained to her the reason for admission but pt is persistent about wanting to go home. Dr. Joylene Igo contacted and pt spoke with her about possible consequences and risk for worsening symptoms if not treated.  Pt still refusing to be admitted, AMA signed.

## 2020-09-07 NOTE — Consult Note (Signed)
PHARMACY -  BRIEF ANTIBIOTIC NOTE   Pharmacy has received consult(s) for vancomycin and cefepime from an ED provider.  The patient's profile has been reviewed for ht/wt/allergies/indication/available labs.    One time order(s) placed for cefepime 2 g IV x 1 dose and vancomycin 1 g IV x 1 dose  Further antibiotics/pharmacy consults should be ordered by admitting physician if indicated.                       Thank you, Reatha Armour 09/07/2020  6:17 PM

## 2020-09-07 NOTE — ED Triage Notes (Addendum)
Pt to ER via [facility] with complaints of shortness of breath. Pt reports productive cough x3 weeks. O2 sats 90% with Ems, placed on 4L via [place] with improvement to 92%.   Pt also received 125mg  iv solumedrol and x2 duonebs. Pt reports she was recently seen for the same. Reports improvement in symptoms from neb treatments. Also reports recent hospitalisation for pneumonia. Reports fever and chills at home this week as well as nausea and vomiting.   Pt reports smoking.

## 2020-09-12 LAB — CULTURE, BLOOD (ROUTINE X 2)
Culture: NO GROWTH
Culture: NO GROWTH
Special Requests: ADEQUATE
Special Requests: ADEQUATE

## 2021-01-18 ENCOUNTER — Emergency Department
Admission: EM | Admit: 2021-01-18 | Discharge: 2021-01-18 | Disposition: A | Discharge status: Home / Self Care | Payer: Self-pay | Attending: Emergency Medicine | Admitting: Emergency Medicine

## 2021-01-18 ENCOUNTER — Other Ambulatory Visit: Payer: Self-pay

## 2021-01-18 ENCOUNTER — Emergency Department: Payer: Self-pay

## 2021-01-18 DIAGNOSIS — Z20822 Contact with and (suspected) exposure to covid-19: Secondary | ICD-10-CM | POA: Insufficient documentation

## 2021-01-18 DIAGNOSIS — Z8616 Personal history of COVID-19: Secondary | ICD-10-CM | POA: Insufficient documentation

## 2021-01-18 DIAGNOSIS — J441 Chronic obstructive pulmonary disease with (acute) exacerbation: Secondary | ICD-10-CM | POA: Insufficient documentation

## 2021-01-18 DIAGNOSIS — R0602 Shortness of breath: Secondary | ICD-10-CM

## 2021-01-18 DIAGNOSIS — F17210 Nicotine dependence, cigarettes, uncomplicated: Secondary | ICD-10-CM | POA: Insufficient documentation

## 2021-01-18 LAB — COMPREHENSIVE METABOLIC PANEL
ALT: 31 U/L (ref 0–44)
AST: 61 U/L — ABNORMAL HIGH (ref 15–41)
Albumin: 3.9 g/dL (ref 3.5–5.0)
Alkaline Phosphatase: 65 U/L (ref 38–126)
Anion gap: 11 (ref 5–15)
BUN: <5 mg/dL — ABNORMAL LOW (ref 6–20)
CO2: 23 mmol/L (ref 22–32)
Calcium: 9.1 mg/dL (ref 8.9–10.3)
Chloride: 102 mmol/L (ref 98–111)
Creatinine, Ser: 0.45 mg/dL (ref 0.44–1.00)
GFR, Estimated: >60 mL/min (ref >60)
Glucose, Bld: 88 mg/dL (ref 70–99)
Potassium: 3.8 mmol/L (ref 3.5–5.1)
Sodium: 136 mmol/L (ref 135–145)
Total Bilirubin: 1 mg/dL (ref 0.3–1.2)
Total Protein: 7.8 g/dL (ref 6.5–8.1)

## 2021-01-18 LAB — CBC
HCT: 44 % (ref 36.0–46.0)
Hemoglobin: 15.9 g/dL — ABNORMAL HIGH (ref 12.0–15.0)
MCH: 37.9 pg — ABNORMAL HIGH (ref 26.0–34.0)
MCHC: 36.1 g/dL — ABNORMAL HIGH (ref 30.0–36.0)
MCV: 104.8 fL — ABNORMAL HIGH (ref 80.0–100.0)
Platelets: 259 10*3/uL (ref 150–400)
RBC: 4.2 MIL/uL (ref 3.87–5.11)
RDW: 12.4 % (ref 11.5–15.5)
WBC: 8.3 10*3/uL (ref 4.0–10.5)
nRBC: 0 % (ref 0.0–0.2)

## 2021-01-18 LAB — RESP PANEL BY RT-PCR (FLU A&B, COVID) ARPGX2
Influenza A by PCR: NEGATIVE
Influenza B by PCR: NEGATIVE
SARS Coronavirus 2 by RT PCR: NEGATIVE

## 2021-01-18 LAB — TROPONIN I (HIGH SENSITIVITY): Troponin I (High Sensitivity): 3 ng/L (ref <18)

## 2021-01-18 LAB — LACTIC ACID, PLASMA: Lactic Acid, Venous: 3.1 mmol/L (ref 0.5–1.9)

## 2021-01-18 MED ORDER — IPRATROPIUM-ALBUTEROL 0.5-2.5 (3) MG/3ML IN SOLN: 9.0000 mL | Freq: Once | RESPIRATORY_TRACT | Status: AC

## 2021-01-18 MED ORDER — PREDNISOLONE SODIUM PHOSPHATE 15 MG/5ML PO SOLN: 40.0000 mg | Freq: Every day | ORAL | 0 refills | Status: AC

## 2021-01-18 MED ORDER — IPRATROPIUM-ALBUTEROL 0.5-2.5 (3) MG/3ML IN SOLN
RESPIRATORY_TRACT | Status: AC
  Administered 2021-01-18: 9 mL via RESPIRATORY_TRACT
  Filled 2021-01-18: qty 9

## 2021-01-18 MED ORDER — IPRATROPIUM-ALBUTEROL 20-100 MCG/ACT IN AERS
1.0000 | INHALATION_SPRAY | Freq: Once | RESPIRATORY_TRACT | Status: AC
  Administered 2021-01-18: 1 via RESPIRATORY_TRACT
  Filled 2021-01-18: qty 4

## 2021-01-18 MED ORDER — AEROCHAMBER PLUS FLO-VU MEDIUM MISC
1.0000 | Freq: Once | Status: AC
  Administered 2021-01-18: 1
  Filled 2021-01-18: qty 1

## 2021-01-18 MED ORDER — LACTATED RINGERS IV BOLUS: 1000.0000 mL | Freq: Once | INTRAVENOUS | Status: DC

## 2021-01-18 MED ORDER — METHYLPREDNISOLONE SODIUM SUCC 125 MG IJ SOLR
125.0000 mg | Freq: Once | INTRAMUSCULAR | Status: AC
  Administered 2021-01-18: 125 mg via INTRAMUSCULAR
  Filled 2021-01-18: qty 2

## 2021-01-18 NOTE — ED Provider Notes (Signed)
[facility] Emergency Department Provider Note   ____________________________________________   Event Date/Time   First MD Initiated Contact with Patient 01/18/21 [phone]     (approximate)  I have reviewed the triage vital signs and the nursing notes.   HISTORY  Chief Complaint Shortness of Breath    HPI Kelly Patterson is a 35 y.o. female who presents for shortness of breath  LOCATION: Chest DURATION: 24 hours prior to arrival TIMING: Worsening since onset SEVERITY: Severe QUALITY: Shortness of breath CONTEXT: Patient states that she has been having intermittent breathing problems since she had COVID approximately 4 months ago.  Patient states that over the last 24 hours she has been having worsening shortness of breath with dyspnea on exertion and audible wheezing.  Patient is an active smoker of over 1 pack/day MODIFYING FACTORS: Any movement worsens her shortness of breath and is partially relieved at rest and with her friend's inhaler ASSOCIATED SYMPTOMS: Wheezing, dyspnea on exertion, cough productive of white sputum   Per medical record review, patient has history of of tobacco abuse          No past medical history on file.  Patient Active Problem List   Diagnosis Date Noted   Sepsis (HCC) 09/07/2020   CAP (community acquired pneumonia) 09/07/2020   Nicotine dependence 09/07/2020   Alcohol abuse 09/07/2020   Acute respiratory failure (HCC) 08/26/2020    No past surgical history on file.  Prior to Admission medications   Medication Sig Start Date End Date Taking? Authorizing Provider  prednisoLONE (ORAPRED) 15 MG/5ML solution Take 13.3 mLs (40 mg total) by mouth daily for 3 days. 01/18/21 01/21/21 Yes Merwyn Katos, MD    Allergies Patient has no known allergies.  Family History  Problem Relation Age of Onset   Hypertension Mother    Breast cancer Other     Social History Social History   Tobacco Use   Smoking  status: Every Day    Packs/day: 1.50    Types: Cigarettes   Smokeless tobacco: Never  Vaping Use   Vaping Use: Never used  Substance Use Topics   Alcohol use: Yes    Alcohol/week: 12.0 standard drinks    Types: 12 Cans of beer per week    Comment: patient states that she drinks 15 12oz beers daily   Drug use: Yes    Types: Marijuana    Review of Systems Constitutional: No fever/chills Eyes: No visual changes. ENT: No sore throat. Cardiovascular: Denies chest pain. Respiratory: Endorses shortness of breath and cough productive of white sputum.  Endorses dyspnea on exertion Gastrointestinal: No abdominal pain.  No nausea, no vomiting.  No diarrhea. Genitourinary: Negative for dysuria. Musculoskeletal: Negative for acute arthralgias Skin: Negative for rash. Neurological: Negative for headaches, weakness/numbness/paresthesias in any extremity Psychiatric: Negative for suicidal ideation/homicidal ideation   ____________________________________________   PHYSICAL EXAM:  VITAL SIGNS: ED Triage Vitals  Enc Vitals Group     BP 01/18/21 0145 (!) 114/99     Pulse Rate 01/18/21 0145 98     Resp 01/18/21 0145 16     Temp 01/18/21 0145 98.4 F (36.9 C)     Temp Source 01/18/21 0145 Oral     SpO2 01/18/21 0145 93 %     Weight 01/18/21 0221 132 lb 4.4 oz (60 kg)     Height 01/18/21 0221 5\' 6"  (1.676 m)     Head Circumference --      Peak Flow --  Pain Score --      Pain Loc --      Pain Edu? --      Excl. in GC? --    Constitutional: Alert and oriented. Well appearing and in no acute distress. Eyes: Conjunctivae are normal. PERRL. Head: Atraumatic. Nose: No congestion/rhinnorhea. Mouth/Throat: Mucous membranes are moist. Neck: No stridor Cardiovascular: Grossly normal heart sounds.  Good peripheral circulation. Respiratory: Inspiratory and expiratory wheezes over bilateral lung fields.  Mildly increased respiratory effort.  No retractions. Gastrointestinal: Soft and  nontender. No distention. Musculoskeletal: No obvious deformities Neurologic:  Normal speech and language. No gross focal neurologic deficits are appreciated. Skin:  Skin is warm and dry. No rash noted. Psychiatric: Mood and affect are normal. Speech and behavior are normal.  ____________________________________________   LABS (all labs ordered are listed, but only abnormal results are displayed)  Labs Reviewed  CBC - Abnormal; Notable for the following components:      Result Value   Hemoglobin 15.9 (*)    MCV 104.8 (*)    MCH 37.9 (*)    MCHC 36.1 (*)    All other components within normal limits  LACTIC ACID, PLASMA - Abnormal; Notable for the following components:   Lactic Acid, Venous 3.1 (*)    All other components within normal limits  COMPREHENSIVE METABOLIC PANEL - Abnormal; Notable for the following components:   BUN <5 (*)    AST 61 (*)    All other components within normal limits  RESP PANEL BY RT-PCR (FLU A&B, COVID) ARPGX2  D-DIMER, QUANTITATIVE  POC URINE PREG, ED  TROPONIN I (HIGH SENSITIVITY)   ____________________________________________  EKG  ED ECG REPORT I, Merwyn Katos, the attending physician, personally viewed and interpreted this ECG.  Date: 01/18/2021 EKG Time: 152 Rate: 102 Rhythm: Tachycardic sinus rhythm QRS Axis: normal Intervals: normal ST/T Wave abnormalities: normal Narrative Interpretation: Tachycardic sinus rhythm.  No evidence of acute ischemia  ____________________________________________  RADIOLOGY  ED MD interpretation: 2 view chest x-ray shows no evidence of acute abnormalities including no pneumonia, pneumothorax, or widened mediastinum  Official radiology report(s): DG Chest 2 View  Result Date: 01/18/2021 CLINICAL DATA:  Chest pain and shortness of breath. EXAM: CHEST - 2 VIEW COMPARISON:  Chest radiograph dated 09/07/2020. FINDINGS: No focal consolidation, pleural effusion or pneumothorax. The cardiac silhouette is  within normal limits. A 5 mm nodule in the lateral right mid lung field similar to prior radiograph, possibly a granuloma. No acute osseous pathology. IMPRESSION: No active cardiopulmonary disease. Electronically Signed   By: Elgie Collard M.D.   On: 01/18/2021 02:15    ____________________________________________   PROCEDURES  Procedure(s) performed (including Critical Care):  .1-3 Lead EKG Interpretation Performed by: Merwyn Katos, MD Authorized by: Merwyn Katos, MD     Interpretation: normal     ECG rate:  90   ECG rate assessment: normal     Rhythm: sinus rhythm     Ectopy: none     Conduction: normal     ____________________________________________   INITIAL IMPRESSION / ASSESSMENT AND PLAN / ED COURSE  As part of my medical decision making, I reviewed the following data within the electronic medical record, if available:  Nursing notes reviewed and incorporated, Labs reviewed, EKG interpreted, Old chart reviewed, Radiograph reviewed and Notes from prior ED visits reviewed and incorporated        The patient appears to be suffering from a moderate exacerbation of COPD.  Based on the history, exam,  CXR/EKG, and further workup I dont suspect any other emergent cause of this presentation, such as pneumonia, acute coronary syndrome, congestive heart failure, pulmonary embolism, or pneumothorax.  ED Interventions: bronchodilators, steroids, reassess  0452 Reassessment: After treatment, the patients shortness of breath is resolved, and their lung exam has returned to baseline. They are comfortable and want to go home.  Rx: Steroids, combivent Disposition: Discharge home with SRP. PCP follow up recommended in next 48hours.      ____________________________________________   FINAL CLINICAL IMPRESSION(S) / ED DIAGNOSES  Final diagnoses:  COPD exacerbation (HCC)  SOB (shortness of breath)     ED Discharge Orders          Ordered    prednisoLONE  (ORAPRED) 15 MG/5ML solution  Daily        01/18/21 0449             Note:  This document was prepared using Dragon voice recognition software and may include unintentional dictation errors.    Merwyn Katos, MD 01/18/21 [phone]

## 2021-01-18 NOTE — ED Triage Notes (Signed)
Pt arrived via POV with reports of shortness of breath x 2 weeks, pt unable to speak due to shortness of breath, breath sounds diminished bilaterally.  Pt c/o chest pain and LLQ pain. Pt tearful in triage.   Visitor states pt had recent PNA

## 2021-01-18 NOTE — ED Notes (Signed)
Lab reports lactic 3.1; acuity level changed and charge nurse notified

## 2021-02-09 ENCOUNTER — Encounter: Payer: Self-pay | Admitting: Emergency Medicine

## 2021-02-09 ENCOUNTER — Emergency Department
Admission: EM | Admit: 2021-02-09 | Discharge: 2021-02-10 | Disposition: A | Discharge status: Home / Self Care | Payer: Self-pay | Attending: Emergency Medicine | Admitting: Emergency Medicine

## 2021-02-09 ENCOUNTER — Other Ambulatory Visit: Payer: Self-pay

## 2021-02-09 ENCOUNTER — Emergency Department: Payer: Self-pay

## 2021-02-09 DIAGNOSIS — Z5321 Procedure and treatment not carried out due to patient leaving prior to being seen by health care provider: Secondary | ICD-10-CM | POA: Insufficient documentation

## 2021-02-09 DIAGNOSIS — J449 Chronic obstructive pulmonary disease, unspecified: Secondary | ICD-10-CM | POA: Insufficient documentation

## 2021-02-09 DIAGNOSIS — R0602 Shortness of breath: Secondary | ICD-10-CM | POA: Insufficient documentation

## 2021-02-09 HISTORY — DX: Chronic obstructive pulmonary disease, unspecified: J44.9

## 2021-02-09 LAB — CBC
HCT: 45.6 % (ref 36.0–46.0)
Hemoglobin: 15.7 g/dL — ABNORMAL HIGH (ref 12.0–15.0)
MCH: 37.1 pg — ABNORMAL HIGH (ref 26.0–34.0)
MCHC: 34.4 g/dL (ref 30.0–36.0)
MCV: 107.8 fL — ABNORMAL HIGH (ref 80.0–100.0)
Platelets: 212 10*3/uL (ref 150–400)
RBC: 4.23 MIL/uL (ref 3.87–5.11)
RDW: 12.4 % (ref 11.5–15.5)
WBC: 15.8 10*3/uL — ABNORMAL HIGH (ref 4.0–10.5)
nRBC: 0 % (ref 0.0–0.2)

## 2021-02-09 LAB — BASIC METABOLIC PANEL
Anion gap: 11 (ref 5–15)
BUN: <5 mg/dL — ABNORMAL LOW (ref 6–20)
CO2: 21 mmol/L — ABNORMAL LOW (ref 22–32)
Calcium: 8.9 mg/dL (ref 8.9–10.3)
Chloride: 103 mmol/L (ref 98–111)
Creatinine, Ser: 0.36 mg/dL — ABNORMAL LOW (ref 0.44–1.00)
GFR, Estimated: >60 mL/min (ref >60)
Glucose, Bld: 102 mg/dL — ABNORMAL HIGH (ref 70–99)
Potassium: 3.4 mmol/L — ABNORMAL LOW (ref 3.5–5.1)
Sodium: 135 mmol/L (ref 135–145)

## 2021-02-09 MED ORDER — ALBUTEROL SULFATE (2.5 MG/3ML) 0.083% IN NEBU: 2.5000 mg | INHALATION_SOLUTION | Freq: Once | RESPIRATORY_TRACT | Status: AC

## 2021-02-09 MED ORDER — ALBUTEROL SULFATE HFA 108 (90 BASE) MCG/ACT IN AERS
2.0000 | INHALATION_SPRAY | RESPIRATORY_TRACT | Status: DC | PRN
  Filled 2021-02-09: qty 6.7

## 2021-02-09 MED ORDER — ALBUTEROL SULFATE (2.5 MG/3ML) 0.083% IN NEBU
INHALATION_SOLUTION | RESPIRATORY_TRACT | Status: AC
  Administered 2021-02-09: 2.5 mg via RESPIRATORY_TRACT
  Filled 2021-02-09: qty 3

## 2021-02-09 NOTE — ED Triage Notes (Signed)
Pt comes into the ED via POV c/o SHOB.  Pt states she was diagnosed with COPD 2 weeks ago and d/c with inhalers, steroids, and breathing treatments.  PT states her inhalers are already almost out.  Pt explains the [facility] is worse with exertion.  Pt presents labored in breathing at this time with audible wheezing.

## 2021-02-16 ENCOUNTER — Other Ambulatory Visit: Payer: Self-pay

## 2021-02-16 ENCOUNTER — Emergency Department
Admission: EM | Admit: 2021-02-16 | Discharge: 2021-02-16 | Disposition: A | Discharge status: Home / Self Care | Payer: Self-pay | Attending: Emergency Medicine | Admitting: Emergency Medicine

## 2021-02-16 ENCOUNTER — Emergency Department: Payer: Self-pay

## 2021-02-16 DIAGNOSIS — Z20822 Contact with and (suspected) exposure to covid-19: Secondary | ICD-10-CM | POA: Insufficient documentation

## 2021-02-16 DIAGNOSIS — J441 Chronic obstructive pulmonary disease with (acute) exacerbation: Secondary | ICD-10-CM

## 2021-02-16 DIAGNOSIS — F17210 Nicotine dependence, cigarettes, uncomplicated: Secondary | ICD-10-CM | POA: Insufficient documentation

## 2021-02-16 DIAGNOSIS — J101 Influenza due to other identified influenza virus with other respiratory manifestations: Secondary | ICD-10-CM | POA: Insufficient documentation

## 2021-02-16 DIAGNOSIS — J449 Chronic obstructive pulmonary disease, unspecified: Secondary | ICD-10-CM | POA: Insufficient documentation

## 2021-02-16 LAB — COMPREHENSIVE METABOLIC PANEL
ALT: 21 U/L (ref 0–44)
AST: 27 U/L (ref 15–41)
Albumin: 3.4 g/dL — ABNORMAL LOW (ref 3.5–5.0)
Alkaline Phosphatase: 79 U/L (ref 38–126)
Anion gap: 8 (ref 5–15)
BUN: 7 mg/dL (ref 6–20)
CO2: 25 mmol/L (ref 22–32)
Calcium: 8.5 mg/dL — ABNORMAL LOW (ref 8.9–10.3)
Chloride: 97 mmol/L — ABNORMAL LOW (ref 98–111)
Creatinine, Ser: 0.53 mg/dL (ref 0.44–1.00)
GFR, Estimated: >60 mL/min (ref >60)
Glucose, Bld: 129 mg/dL — ABNORMAL HIGH (ref 70–99)
Potassium: 3.4 mmol/L — ABNORMAL LOW (ref 3.5–5.1)
Sodium: 130 mmol/L — ABNORMAL LOW (ref 135–145)
Total Bilirubin: 0.8 mg/dL (ref 0.3–1.2)
Total Protein: 7.8 g/dL (ref 6.5–8.1)

## 2021-02-16 LAB — CBC
HCT: 46.2 % — ABNORMAL HIGH (ref 36.0–46.0)
Hemoglobin: 16.1 g/dL — ABNORMAL HIGH (ref 12.0–15.0)
MCH: 36.8 pg — ABNORMAL HIGH (ref 26.0–34.0)
MCHC: 34.8 g/dL (ref 30.0–36.0)
MCV: 105.5 fL — ABNORMAL HIGH (ref 80.0–100.0)
Platelets: 211 10*3/uL (ref 150–400)
RBC: 4.38 MIL/uL (ref 3.87–5.11)
RDW: 12.1 % (ref 11.5–15.5)
WBC: 13.7 10*3/uL — ABNORMAL HIGH (ref 4.0–10.5)
nRBC: 0 % (ref 0.0–0.2)

## 2021-02-16 LAB — RESP PANEL BY RT-PCR (FLU A&B, COVID) ARPGX2
Influenza A by PCR: POSITIVE — AB
Influenza B by PCR: NEGATIVE
SARS Coronavirus 2 by RT PCR: NEGATIVE

## 2021-02-16 LAB — HCG, QUANTITATIVE, PREGNANCY: hCG, Beta Chain, Quant, S: <1 m[IU]/mL (ref <5)

## 2021-02-16 LAB — MAGNESIUM: Magnesium: 2.2 mg/dL (ref 1.7–2.4)

## 2021-02-16 MED ORDER — ALBUTEROL SULFATE HFA 108 (90 BASE) MCG/ACT IN AERS
2.0000 | INHALATION_SPRAY | Freq: Once | RESPIRATORY_TRACT | Status: AC
  Administered 2021-02-16: 2 via RESPIRATORY_TRACT
  Filled 2021-02-16: qty 6.7

## 2021-02-16 MED ORDER — DEXAMETHASONE SODIUM PHOSPHATE 10 MG/ML IJ SOLN
10.0000 mg | Freq: Once | INTRAMUSCULAR | Status: AC
  Administered 2021-02-16: 10 mg via INTRAMUSCULAR
  Filled 2021-02-16: qty 1

## 2021-02-16 MED ORDER — IPRATROPIUM-ALBUTEROL 20-100 MCG/ACT IN AERS
1.0000 | INHALATION_SPRAY | Freq: Four times a day (QID) | RESPIRATORY_TRACT | Status: DC
  Administered 2021-02-16: 1 via RESPIRATORY_TRACT
  Filled 2021-02-16: qty 4

## 2021-02-16 MED ORDER — OSELTAMIVIR PHOSPHATE 75 MG PO CAPS: 75.0000 mg | ORAL_CAPSULE | Freq: Two times a day (BID) | ORAL | 0 refills | Status: AC

## 2021-02-16 NOTE — Discharge Instructions (Signed)
Please begin taking Tamiflu as soon as possible.  Please use your albuterol inhaler 2 puffs every 4 hours as needed for shortness of breath.  Combivent is to be used once daily.  Return to the emergency department for any worsening shortness of breath or any other symptom personally concerning to yourself.  Otherwise please use Tylenol or ibuprofen every 6 hours for fever/discomfort, obtain plenty of rest and drink plenty of fluids.  Follow-up with your primary care doctor within the next several days for recheck/reevaluation.

## 2021-02-16 NOTE — ED Notes (Signed)
D/C and reasons to return to ED discussed with pt. Pt verbalized understanding.   Pt educated to stop smoking.

## 2021-02-16 NOTE — ED Notes (Signed)
Pt c/o of feeling congested with a productive cough and SOB that started around 3 days ago. Pt also c/o generalized weakness.

## 2021-02-16 NOTE — ED Provider Notes (Signed)
[facility] Emergency Department Provider Note  Time seen: 10:52 AM  I have reviewed the triage vital signs and the nursing notes.   HISTORY  Chief Complaint Shortness of Breath and flu like symptoms   HPI Kelly Patterson is a 35 y.o. female with a past medical history of COPD presents to the emergency department for worsening cough congestion and shortness of breath.  According to the patient for the past 3 days she has had cough congestion shortness of breath wet sounding cough or sputum production per patient.  Patient states multiple people in her household have recently been diagnosed with influenza A and she is concerned she could have influenza.  Patient states low-grade temperature at home.  Patient does smoke cigarettes.   Past Medical History:  Diagnosis Date   COPD (chronic obstructive pulmonary disease) (HCC)     Patient Active Problem List   Diagnosis Date Noted   Sepsis (HCC) 09/07/2020   CAP (community acquired pneumonia) 09/07/2020   Nicotine dependence 09/07/2020   Alcohol abuse 09/07/2020   Acute respiratory failure (HCC) 08/26/2020    No past surgical history on file.  Prior to Admission medications   Not on File    No Known Allergies  Family History  Problem Relation Age of Onset   Hypertension Mother    Breast cancer Other     Social History Social History   Tobacco Use   Smoking status: Every Day    Packs/day: 1.50    Types: Cigarettes   Smokeless tobacco: Never  Vaping Use   Vaping Use: Never used  Substance Use Topics   Alcohol use: Yes    Alcohol/week: 12.0 standard drinks    Types: 12 Cans of beer per week    Comment: patient states that she drinks 15 12oz beers daily   Drug use: Yes    Types: Marijuana    Review of Systems Constitutional: Low-grade fever ENT: Positive for congestion Cardiovascular: Negative for chest pain. Respiratory: Positive for shortness of breath with cough Gastrointestinal:  Negative for abdominal pain, vomiting Musculoskeletal: Negative for musculoskeletal complaints Neurological: Negative for headache All other ROS negative  ____________________________________________   PHYSICAL EXAM:  VITAL SIGNS: ED Triage Vitals  Enc Vitals Group     BP 02/16/21 0812 103/80     Pulse Rate 02/16/21 0812 (!) 120     Resp 02/16/21 0812 20     Temp 02/16/21 0812 99.6 F (37.6 C)     Temp Source 02/16/21 0812 Oral     SpO2 02/16/21 0812 94 %     Weight 02/16/21 0819 115 lb (52.2 kg)     Height 02/16/21 0819 5\' 6"  (1.676 m)     Head Circumference --      Peak Flow --      Pain Score 02/16/21 0818 8     Pain Loc --      Pain Edu? --      Excl. in GC? --    Constitutional: Alert and oriented. Well appearing and in no distress. Eyes: Normal exam ENT      Head: Normocephalic and atraumatic.      Mouth/Throat: Mucous membranes are moist. Cardiovascular: Normal rate, regular rhythm around 100 bpm. Respiratory: Normal respiratory effort without tachypnea nor retractions.  Mild wheeze bilaterally. Gastrointestinal: Soft and nontender. No distention.  Musculoskeletal: Nontender with normal range of motion in all extremities.  Neurologic:  Normal speech and language. No gross focal neurologic deficits  Skin:  Skin  is warm, dry and intact.  Psychiatric: Mood and affect are normal.   ____________________________________________     RADIOLOGY  Chest x-ray shows no acute abnormality  ____________________________________________   INITIAL IMPRESSION / ASSESSMENT AND PLAN / ED COURSE  Pertinent labs & imaging results that were available during my care of the patient were reviewed by me and considered in my medical decision making (see chart for details).   Patient presents emergency department for cough congestion over the past 3 days with multiple family members testing positive for influenza recently.  In the emergency department patient does have mild  expiratory wheeze, states a history of COPD is also a current smoker.  Satting 94 to 95% on room air low-grade temperature 99.6, heart rate around 100 bpm during my evaluation.  Patient states she does not have insurance does not have an albuterol inhaler or any other inhalers at home currently.  We will dose IM Decadron, attempt to obtain albuterol/Combivent inhalers for the patient.  I do believe patient is safe for discharge home.  We will discharge on Tamiflu given the patient's comorbidities and symptoms starting within the past 3 days.  Discussed my typical influenza supportive care at home as well as return precautions.  Kelly Patterson was evaluated in Emergency Department on 02/16/2021 for the symptoms described in the history of present illness. She was evaluated in the context of the global COVID-19 pandemic, which necessitated consideration that the patient might be at risk for infection with the SARS-CoV-2 virus that causes COVID-19. Institutional protocols and algorithms that pertain to the evaluation of patients at risk for COVID-19 are in a state of rapid change based on information released by regulatory bodies including the CDC and federal and state organizations. These policies and algorithms were followed during the patient's care in the ED.  ____________________________________________   FINAL CLINICAL IMPRESSION(S) / ED DIAGNOSES  COPD Influenza A   Minna Antis, MD 02/16/21 1056

## 2021-02-16 NOTE — ED Triage Notes (Signed)
Pt reports hx of copd, pt hasn't been feeling well for the past 3-4 days, pt reports recent diagnosis of copd and states everyone that she lives with has the flu currently

## 2021-03-01 ENCOUNTER — Other Ambulatory Visit: Payer: Self-pay

## 2021-03-01 ENCOUNTER — Emergency Department: Payer: Self-pay

## 2021-03-01 ENCOUNTER — Emergency Department
Admission: EM | Admit: 2021-03-01 | Discharge: 2021-03-02 | Disposition: A | Discharge status: Home / Self Care | Payer: Self-pay | Attending: Emergency Medicine | Admitting: Emergency Medicine

## 2021-03-01 DIAGNOSIS — M79602 Pain in left arm: Secondary | ICD-10-CM | POA: Insufficient documentation

## 2021-03-01 DIAGNOSIS — R042 Hemoptysis: Secondary | ICD-10-CM | POA: Insufficient documentation

## 2021-03-01 DIAGNOSIS — J441 Chronic obstructive pulmonary disease with (acute) exacerbation: Secondary | ICD-10-CM | POA: Insufficient documentation

## 2021-03-01 DIAGNOSIS — M79605 Pain in left leg: Secondary | ICD-10-CM | POA: Insufficient documentation

## 2021-03-01 DIAGNOSIS — R531 Weakness: Secondary | ICD-10-CM | POA: Insufficient documentation

## 2021-03-01 DIAGNOSIS — R209 Unspecified disturbances of skin sensation: Secondary | ICD-10-CM | POA: Insufficient documentation

## 2021-03-01 DIAGNOSIS — R519 Headache, unspecified: Secondary | ICD-10-CM | POA: Insufficient documentation

## 2021-03-01 DIAGNOSIS — F17210 Nicotine dependence, cigarettes, uncomplicated: Secondary | ICD-10-CM | POA: Insufficient documentation

## 2021-03-01 DIAGNOSIS — J449 Chronic obstructive pulmonary disease, unspecified: Secondary | ICD-10-CM | POA: Insufficient documentation

## 2021-03-01 LAB — CBC
HCT: 43.2 % (ref 36.0–46.0)
Hemoglobin: 14.5 g/dL (ref 12.0–15.0)
MCH: 35.9 pg — ABNORMAL HIGH (ref 26.0–34.0)
MCHC: 33.6 g/dL (ref 30.0–36.0)
MCV: 106.9 fL — ABNORMAL HIGH (ref 80.0–100.0)
Platelets: 301 10*3/uL (ref 150–400)
RBC: 4.04 MIL/uL (ref 3.87–5.11)
RDW: 12.5 % (ref 11.5–15.5)
WBC: 12 10*3/uL — ABNORMAL HIGH (ref 4.0–10.5)
nRBC: 0 % (ref 0.0–0.2)

## 2021-03-01 LAB — BASIC METABOLIC PANEL
Anion gap: 10 (ref 5–15)
BUN: <5 mg/dL — ABNORMAL LOW (ref 6–20)
CO2: 24 mmol/L (ref 22–32)
Calcium: 9 mg/dL (ref 8.9–10.3)
Chloride: 104 mmol/L (ref 98–111)
Creatinine, Ser: 0.41 mg/dL — ABNORMAL LOW (ref 0.44–1.00)
GFR, Estimated: >60 mL/min (ref >60)
Glucose, Bld: 94 mg/dL (ref 70–99)
Potassium: 3.5 mmol/L (ref 3.5–5.1)
Sodium: 138 mmol/L (ref 135–145)

## 2021-03-01 LAB — TROPONIN I (HIGH SENSITIVITY)
Troponin I (High Sensitivity): <2 ng/L (ref <18)
Troponin I (High Sensitivity): 4 ng/L (ref <18)

## 2021-03-01 LAB — POC URINE PREG, ED: Preg Test, Ur: NEGATIVE

## 2021-03-01 MED ORDER — KETOROLAC TROMETHAMINE 30 MG/ML IJ SOLN
30.0000 mg | Freq: Once | INTRAMUSCULAR | Status: AC
  Administered 2021-03-01: 30 mg via INTRAVENOUS
  Filled 2021-03-01: qty 1

## 2021-03-01 MED ORDER — IPRATROPIUM-ALBUTEROL 0.5-2.5 (3) MG/3ML IN SOLN
3.0000 mL | Freq: Once | RESPIRATORY_TRACT | Status: AC
  Administered 2021-03-01: 3 mL via RESPIRATORY_TRACT
  Filled 2021-03-01: qty 3

## 2021-03-01 MED ORDER — NICOTINE 21 MG/24HR TD PT24
21.0000 mg | MEDICATED_PATCH | Freq: Once | TRANSDERMAL | Status: DC
  Administered 2021-03-01: 21 mg via TRANSDERMAL
  Filled 2021-03-01: qty 1

## 2021-03-01 NOTE — ED Provider Notes (Signed)
[facility] Emergency Department Provider Note  ____________________________________________   Event Date/Time   First MD Initiated Contact with Patient 03/01/21 2303     (approximate)  I have reviewed the triage vital signs and the nursing notes.   HISTORY  Chief Complaint Chest Pain    HPI Kelly Patterson is a 35 y.o. female with history of COPD not on oxygen who presents to the emergency department with multiple complaints.  Patient is complaining of pain throughout the left face, head, arm, chest and leg for the past week.  She denies any known injury.  She describes the chest pain as feeling like someone is sitting on her chest and then will have sharp pains.  States she has had shortness of breath which is unchanged over the past 2 months.  States she was diagnosed with COPD in the emergency department.  She does not have a primary care doctor or pulmonologist.  States she has been coughing up clots of blood.  She is not on any blood thinners.  She denies any known fevers.  She did have influenza about 2 weeks ago.  Denies any leg swelling.  No previous history of PE or DVT.  Her mother at the bedside was concerned that she could have had a stroke as she states she has had numbness and weakness on her left side as well and mother reports an intermittent left facial droop.  No history of stroke.  No history of head injury.  No headache.  No family history of MS.        Past Medical History:  Diagnosis Date   COPD (chronic obstructive pulmonary disease) (HCC)     Patient Active Problem List   Diagnosis Date Noted   Sepsis (HCC) 09/07/2020   CAP (community acquired pneumonia) 09/07/2020   Nicotine dependence 09/07/2020   Alcohol abuse 09/07/2020   Acute respiratory failure (HCC) 08/26/2020    History reviewed. No pertinent surgical history.  Prior to Admission medications   Medication Sig Start Date End Date Taking? Authorizing Provider   prednisoLONE (ORAPRED) 15 MG/5ML solution Take 20 mLs (60 mg total) by mouth daily for 5 days. 03/02/21 03/07/21 Yes Aislyn Hayse, Layla Maw, DO    Allergies Patient has no known allergies.  Family History  Problem Relation Age of Onset   Hypertension Mother    Breast cancer Other     Social History Social History   Tobacco Use   Smoking status: Every Day    Packs/day: 1.50    Types: Cigarettes   Smokeless tobacco: Never  Vaping Use   Vaping Use: Never used  Substance Use Topics   Alcohol use: Yes    Alcohol/week: 12.0 standard drinks    Types: 12 Cans of beer per week    Comment: patient states that she drinks 15 12oz beers daily   Drug use: Yes    Types: Marijuana    Review of Systems Constitutional: No fever. Eyes: No visual changes. ENT: No sore throat. Cardiovascular: +chest pain. Respiratory: +shortness of breath. Gastrointestinal: No nausea, vomiting, diarrhea. Genitourinary: Negative for dysuria. Musculoskeletal: Negative for back pain. Skin: Negative for rash. Neurological: Negative for focal weakness or numbness.  ____________________________________________   PHYSICAL EXAM:  VITAL SIGNS: ED Triage Vitals  Enc Vitals Group     BP 03/01/21 1939 128/81     Pulse Rate 03/01/21 1939 (!) 123     Resp 03/01/21 1939 18     Temp 03/01/21 1939 98.6 F (37  C)     Temp Source 03/01/21 1939 Oral     SpO2 03/01/21 1939 96 %     Weight 03/01/21 1938 116 lb (52.6 kg)     Height 03/01/21 1938 5\' 6"  (1.676 m)     Head Circumference --      Peak Flow --      Pain Score 03/01/21 1942 8     Pain Loc --      Pain Edu? --      Excl. in GC? --    CONSTITUTIONAL: Alert and oriented and responds appropriately to questions.  Chronically ill-appearing HEAD: Normocephalic EYES: Conjunctivae clear, pupils appear equal, EOM appear intact ENT: normal nose; moist mucous membranes NECK: Supple, normal ROM CARD: RRR; S1 and S2 appreciated; no murmurs, no clicks, no rubs, no  gallops RESP: Normal chest excursion without splinting or tachypnea; breath sounds equal bilaterally with patient does have expiratory wheezing heard diffusely.  No rhonchi or rales.  Speaking full sentences.  No hypoxia or respiratory distress. ABD/GI: Normal bowel sounds; non-distended; soft, non-tender, no rebound, no guarding, no peritoneal signs, no hepatosplenomegaly BACK: The back appears normal EXT: Normal ROM in all joints; no deformity noted, no edema; no cyanosis no calf tenderness or calf swelling SKIN: Normal color for age and race; warm; no rash on exposed skin NEURO: Moves all extremities equally, exam is quite inconsistent.  Patient seems to have effort dependent weakness in the left upper and lower extremity.  Patient appears to have a mild left facial droop that appears volitional.  She reports decreased sensation in the left leg but normal in the face and arm.  Speech is normal.  She was able to ambulate to the bathroom with a quick and steady gait. PSYCH: The patient's mood and manner are appropriate.  ____________________________________________   LABS (all labs ordered are listed, but only abnormal results are displayed)  Labs Reviewed  BASIC METABOLIC PANEL - Abnormal; Notable for the following components:      Result Value   BUN <5 (*)    Creatinine, Ser 0.41 (*)    All other components within normal limits  CBC - Abnormal; Notable for the following components:   WBC 12.0 (*)    MCV 106.9 (*)    MCH 35.9 (*)    All other components within normal limits  POC URINE PREG, ED  TROPONIN I (HIGH SENSITIVITY)  TROPONIN I (HIGH SENSITIVITY)   ____________________________________________  EKG   EKG Interpretation  Date/Time:  Thursday March 01 2021 19:55:41 EST Ventricular Rate:  111 PR Interval:  156 QRS Duration: 66 QT Interval:  358 QTC Calculation: 486 R Axis:   68 Text Interpretation: Sinus tachycardia Right atrial enlargement Possible Anterior infarct  , age undetermined Abnormal ECG Confirmed by 07-08-2004 [phone]) on 03/02/2021 6:21:57 AM        ____________________________________________  RADIOLOGY 14/11/2020 Antrell Tipler, personally viewed and evaluated these images (plain radiographs) as part of my medical decision making, as well as reviewing the written report by the radiologist.  ED MD interpretation: MRI of the brain shows no acute abnormality.  CT of the chest shows no acute abnormality.  Official radiology report(s): DG Chest 2 View  Result Date: 03/01/2021 CLINICAL DATA:  Chest pain EXAM: CHEST - 2 VIEW COMPARISON:  02/16/2021 FINDINGS: The lungs are symmetrically well expanded. Patchy infiltrates within the left mid lung zone have resolved and left basilar infiltrates have significantly improved, though having not yet completely resolved. No pneumothorax or  pleural effusion. Cardiac size within normal limits. Pulmonary vascularity is normal. No acute bone abnormality. IMPRESSION: Nearly resolved multifocal inflammatory pulmonary infiltrate. Electronically Signed   By: Helyn Numbers M.D.   On: 03/01/2021 20:23   CT Angio Chest PE W and/or Wo Contrast  Result Date: 03/02/2021 CLINICAL DATA:  History of flu 2 weeks ago with persistent cough the cysts, initial encounter EXAM: CT ANGIOGRAPHY CHEST WITH CONTRAST TECHNIQUE: Multidetector CT imaging of the chest was performed using the standard protocol during bolus administration of intravenous contrast. Multiplanar CT image reconstructions and MIPs were obtained to evaluate the vascular anatomy. CONTRAST:  10mL OMNIPAQUE IOHEXOL 350 MG/ML SOLN COMPARISON:  03/01/2021 FINDINGS: Cardiovascular: Thoracic aorta shows a normal branching pattern. No aneurysmal dilatation or dissection is noted. No cardiac enlargement is seen. No coronary calcifications are seen. Pulmonary artery shows a normal branching pattern bilaterally. No filling defect to suggest pulmonary embolism is noted.  Mediastinum/Nodes: Thoracic inlet is within normal limits. No sizable hilar or mediastinal adenopathy is noted. The esophagus as visualized is within normal limits. Lungs/Pleura: Lungs are well aerated bilaterally. No focal infiltrate or sizable effusion is seen. Some minimal atelectatic changes are noted in the right upper lobe. No sizable parenchymal nodule is noted. Upper Abdomen: Visualized upper abdomen shows fatty infiltration of the liver. Musculoskeletal: No acute rib abnormality is noted. Old rib fracture on the right is noted. Review of the MIP images confirms the above findings. IMPRESSION: No evidence of pulmonary emboli. Mild right upper lobe atelectatic changes. Fatty liver. Electronically Signed   By: Alcide Clever M.D.   On: 03/02/2021 01:41   MR Brain W and Wo Contrast  Result Date: 03/02/2021 CLINICAL DATA:  Initial evaluation for neuro deficit, stroke suspected. EXAM: MRI HEAD WITHOUT AND WITH CONTRAST TECHNIQUE: Multiplanar, multiecho pulse sequences of the brain and surrounding structures were obtained without and with intravenous contrast. CONTRAST:  10mL GADAVIST GADOBUTROL 1 MMOL/ML IV SOLN COMPARISON:  None. FINDINGS: Brain: Mildly advanced cerebral and cerebellar atrophy for age. No abnormal foci of restricted diffusion to suggest acute or subacute ischemia. Gray-white matter differentiation maintained. No encephalomalacia to suggest chronic cortical infarction. No evidence for acute or chronic intracranial hemorrhage. No mass lesion, midline shift or mass effect. No hydrocephalus or extra-axial fluid collection. Pituitary gland suprasellar region normal. Midline structures intact and normal. No abnormal enhancement. Vascular: Major intracranial vascular flow voids are maintained. Skull and upper cervical spine: Craniocervical junction within normal limits. Bone marrow signal intensity normal. No scalp soft tissue abnormality. Sinuses/Orbits: Globes orbital soft tissues within normal  limits. Moderate mucosal thickening in opacity seen throughout the ethmoidal air cells, sphenoid sinuses, and maxillary sinuses. Trace right mastoid effusion, of doubtful significance. Inner ear structures grossly normal. Other: None. IMPRESSION: 1. No acute intracranial abnormality. 2. Mildly advanced cerebral and cerebellar atrophy for age. 3. Moderate paranasal sinus disease. Electronically Signed   By: Rise Mu M.D.   On: 03/02/2021 01:47    ____________________________________________   PROCEDURES  Procedure(s) performed (including Critical Care):  Procedures    ____________________________________________   INITIAL IMPRESSION / ASSESSMENT AND PLAN / ED COURSE  As part of my medical decision making, I reviewed the following data within the electronic MEDICAL RECORD NUMBER History obtained from family, Nursing notes reviewed and incorporated, Labs reviewed , EKG interpreted , Old EKG reviewed, Old chart reviewed, Radiograph reviewed , CT and MRI reviewed, and Notes from prior ED visits         Patient here with multiple complaints.  Complains of chest pain, shortness of breath and hemoptysis.  She is wheezing here and states she has been diagnosed with COPD in the emergency department.  Discussed with patient that she needs formal evaluation by pulmonologist for official diagnosis but given she does have bronchospasm, will give breathing treatment, steroids.  Chest x-ray shows no new infiltrate, edema.  We discussed possibility of PE, alveolar hemorrhage and recommended CTA of the chest.  She has not had any episodes of hemoptysis while in the ED and has no hypoxia or increased work of breathing.   Patient also complaining of left-sided weakness, numbness and pain for the past 2 weeks.  Her exam seems very effort dependent and is not consistent.  No family history of MS.  Differential includes functional neurologic condition, CVA, MS, mass.  Will obtain MRI of the brain with and  without contrast for further evaluation.  ED PROGRESS  Patient's work-up reassuring.  Normal hemoglobin.  CTA of the chest shows no PE or other acute abnormality.  She reports feeling much better after DuoNeb and has no hypoxia and again has been able to ambulate around the ED without difficulty.  I do not feel she needs observation admission for her hemoptysis given she has not had any episodes in several hours.  As for her left-sided weakness and numbness, her MRI is completely normal.  No sign of stroke or demyelinating lesion.  No mass.  I do not feel she needs imaging of her spine given symptoms involve her face.  This seems possibly psychosomatic.  Her exam is very inconsistent and we discussed that she is very young with minimal risk factors for CVA.  Recommended close follow-up with her primary care provider as well as a pulmonologist.  Will discharge with steroids.  States she is unable to swallow pills and is requesting liquid.  At this time, I do not feel there is any life-threatening condition present. I have reviewed, interpreted and discussed all results (EKG, imaging, lab, urine as appropriate) and exam findings with patient/family. I have reviewed nursing notes and appropriate previous records.  I feel the patient is safe to be discharged home without further emergent workup and can continue workup as an outpatient as needed. Discussed usual and customary return precautions. Patient/family verbalize understanding and are comfortable with this plan.  Outpatient follow-up has been provided as needed. All questions have been answered.  ____________________________________________   FINAL CLINICAL IMPRESSION(S) / ED DIAGNOSES  Final diagnoses:  COPD exacerbation (HCC)  Hemoptysis  Left-sided weakness     ED Discharge Orders          Ordered    Ambulatory referral to Pulmonology        03/02/21 0158    prednisoLONE (ORAPRED) 15 MG/5ML solution  Daily        03/02/21 0238             *Please note:  Merideth Abbey was evaluated in Emergency Department on 03/02/2021 for the symptoms described in the history of present illness. She was evaluated in the context of the global COVID-19 pandemic, which necessitated consideration that the patient might be at risk for infection with the SARS-CoV-2 virus that causes COVID-19. Institutional protocols and algorithms that pertain to the evaluation of patients at risk for COVID-19 are in a state of rapid change based on information released by regulatory bodies including the CDC and federal and state organizations. These policies and algorithms were followed during the patient's care in the ED.  Some ED evaluations and interventions may be delayed as a result of limited staffing during and the pandemic.*   Note:  This document was prepared using Dragon voice recognition software and may include unintentional dictation errors.    Yamil Dougher, Layla Maw, DO 03/02/21 [phone]

## 2021-03-01 NOTE — ED Triage Notes (Signed)
Pt states she was had the flu 2 weeks ago and is now having sharp pains in her head and in her L arm- pt also states that she has had palpitations- pt also states she has been coughing up blood and sometimes clots

## 2021-03-01 NOTE — ED Notes (Signed)
Red top in lab

## 2021-03-01 NOTE — ED Notes (Signed)
Pt smells strongly of alcohol. 

## 2021-03-02 ENCOUNTER — Emergency Department: Payer: Self-pay

## 2021-03-02 MED ORDER — ALBUTEROL SULFATE HFA 108 (90 BASE) MCG/ACT IN AERS
2.0000 | INHALATION_SPRAY | Freq: Once | RESPIRATORY_TRACT | Status: AC
  Administered 2021-03-02: 2 via RESPIRATORY_TRACT
  Filled 2021-03-02: qty 6.7

## 2021-03-02 MED ORDER — METHYLPREDNISOLONE SODIUM SUCC 125 MG IJ SOLR
125.0000 mg | Freq: Once | INTRAMUSCULAR | Status: AC
  Administered 2021-03-02: 125 mg via INTRAMUSCULAR
  Filled 2021-03-02 (×2): qty 2

## 2021-03-02 MED ORDER — PREDNISONE 20 MG PO TABS: 60.0000 mg | ORAL_TABLET | Freq: Once | ORAL | Status: DC

## 2021-03-02 MED ORDER — IOHEXOL 350 MG/ML SOLN
75.0000 mL | Freq: Once | INTRAVENOUS | Status: AC | PRN
  Administered 2021-03-02: 75 mL via INTRAVENOUS

## 2021-03-02 MED ORDER — PREDNISOLONE SODIUM PHOSPHATE 15 MG/5ML PO SOLN: 60.0000 mg | Freq: Every day | ORAL | 0 refills | Status: AC

## 2021-03-02 MED ORDER — GADOBUTROL 1 MMOL/ML IV SOLN
5.0000 mL | Freq: Once | INTRAVENOUS | Status: AC | PRN
  Administered 2021-03-02: 5 mL via INTRAVENOUS

## 2021-03-02 NOTE — Discharge Instructions (Addendum)
Steps to find a Primary Care Provider (PCP):  Call [phone] or [phone] to access "[facility] Find a Doctor Service."  2.  You may also go on the [facility] website at [email or web address]  

## 2021-03-02 NOTE — ED Notes (Signed)
Patient pacing room. States she wants IV out. Patient stated she would not wait for DC to have IV removed and insisted that IV be removed.

## 2021-08-18 ENCOUNTER — Other Ambulatory Visit: Payer: Self-pay

## 2021-08-18 ENCOUNTER — Emergency Department
Admission: EM | Admit: 2021-08-18 | Discharge: 2021-08-18 | Discharge status: Against Medical Advice | Payer: Self-pay | Attending: Emergency Medicine | Admitting: Emergency Medicine

## 2021-08-18 ENCOUNTER — Emergency Department: Payer: Self-pay

## 2021-08-18 DIAGNOSIS — R062 Wheezing: Secondary | ICD-10-CM | POA: Insufficient documentation

## 2021-08-18 DIAGNOSIS — Z5329 Procedure and treatment not carried out because of patient's decision for other reasons: Secondary | ICD-10-CM | POA: Insufficient documentation

## 2021-08-18 DIAGNOSIS — J449 Chronic obstructive pulmonary disease, unspecified: Secondary | ICD-10-CM | POA: Insufficient documentation

## 2021-08-18 DIAGNOSIS — F17200 Nicotine dependence, unspecified, uncomplicated: Secondary | ICD-10-CM | POA: Insufficient documentation

## 2021-08-18 DIAGNOSIS — R000 Tachycardia, unspecified: Secondary | ICD-10-CM | POA: Insufficient documentation

## 2021-08-18 DIAGNOSIS — R059 Cough, unspecified: Secondary | ICD-10-CM | POA: Insufficient documentation

## 2021-08-18 DIAGNOSIS — R0602 Shortness of breath: Secondary | ICD-10-CM | POA: Insufficient documentation

## 2021-08-18 LAB — CBC
HCT: 44.4 % (ref 36.0–46.0)
Hemoglobin: 14.8 g/dL (ref 12.0–15.0)
MCH: 35.5 pg — ABNORMAL HIGH (ref 26.0–34.0)
MCHC: 33.3 g/dL (ref 30.0–36.0)
MCV: 106.5 fL — ABNORMAL HIGH (ref 80.0–100.0)
Platelets: 253 10*3/uL (ref 150–400)
RBC: 4.17 MIL/uL (ref 3.87–5.11)
RDW: 12.4 % (ref 11.5–15.5)
WBC: 6.1 10*3/uL (ref 4.0–10.5)
nRBC: 0 % (ref 0.0–0.2)

## 2021-08-18 LAB — BASIC METABOLIC PANEL WITH GFR
Anion gap: 11 (ref 5–15)
BUN: <5 mg/dL — ABNORMAL LOW (ref 6–20)
CO2: 23 mmol/L (ref 22–32)
Calcium: 8.9 mg/dL (ref 8.9–10.3)
Chloride: 104 mmol/L (ref 98–111)
Creatinine, Ser: 0.34 mg/dL — ABNORMAL LOW (ref 0.44–1.00)
GFR, Estimated: >60 mL/min
Glucose, Bld: 103 mg/dL — ABNORMAL HIGH (ref 70–99)
Potassium: 3.6 mmol/L (ref 3.5–5.1)
Sodium: 138 mmol/L (ref 135–145)

## 2021-08-18 MED ORDER — IPRATROPIUM BROMIDE HFA 17 MCG/ACT IN AERS: 2.0000 | INHALATION_SPRAY | Freq: Four times a day (QID) | RESPIRATORY_TRACT | 2 refills | Status: DC

## 2021-08-18 MED ORDER — AZITHROMYCIN 250 MG PO TABS: ORAL_TABLET | ORAL | 0 refills | Status: DC

## 2021-08-18 MED ORDER — PREDNISONE 20 MG PO TABS
60.0000 mg | ORAL_TABLET | Freq: Once | ORAL | Status: AC
  Administered 2021-08-18: 60 mg via ORAL
  Filled 2021-08-18: qty 3

## 2021-08-18 MED ORDER — PREDNISONE 20 MG PO TABS: 60.0000 mg | ORAL_TABLET | Freq: Every day | ORAL | 0 refills | Status: AC

## 2021-08-18 MED ORDER — ALBUTEROL SULFATE HFA 108 (90 BASE) MCG/ACT IN AERS: 2.0000 | INHALATION_SPRAY | Freq: Four times a day (QID) | RESPIRATORY_TRACT | 2 refills | Status: DC | PRN

## 2021-08-18 NOTE — ED Triage Notes (Signed)
Patient to ER via [facility] with complaints of shortness of breath and productive cough x3 days. Reports green sputum. States she is having a hard time catching her breath, has been using her prescribed inhalers without relief. Also reports feeling like she has a elephant on her chest.

## 2021-08-18 NOTE — Discharge Instructions (Addendum)
Please return to the emergency room right away if your shortness of breath worsens, you develop chest pain, fever, or other concerns arise.  Today we have not come pleated the evaluation that I recommend including treatment for COPD exacerbation and also to evaluate and check to make sure you are not having a heart attack, blood clot in your lungs, or other potentially life-threatening cause.  You may return to the emergency room at any time, and I would encourage you to do so we may continue to evaluate as to cause of your shortness of breath today.

## 2021-08-18 NOTE — ED Provider Notes (Signed)
[facility] Provider Note    Event Date/Time   First MD Initiated Contact with Patient 08/18/21 1837     (approximate)   History   Shortness of Breath   HPI  Kelly Patterson is a 36 y.o. female history of previous respiratory failure, community-acquired pneumonia, nicotine and alcohol dependence review of prior discharge summary  Patient has been experiencing shortness of breath daily for about a month.  She reports that she uses an inhaler, currently using her mother's as hers ran out.  She has COPD and continues to smoke.  She gets wheezing from time to time.  She went to work today and reports that she was wheezing and they felt she looks short of breath and told her she could not come back to the work without going to the doctor  He does report for about the last 3 days her symptoms seem little worse with increased cough and sense of tightness across the chest.  She reports has had the same symptoms many times.  Treats usually with prednisone  She also tells me that she is ready to leave, she does not want to stay any longer as she needs to get back to work.  She does not wish to stay for further treatment, and today she really just wants to get a work note and be able to go back to work and get another inhaler and prednisone   Denies history of heart problems or blood clots.  Works at [employer], no long trips or travels.  No recent surgeries.  Denies pregnancy.  Physical Exam   Triage Vital Signs: ED Triage Vitals  Enc Vitals Group     BP 08/18/21 1713 115/72     Pulse Rate 08/18/21 1713 (!) 109     Resp 08/18/21 1713 (!) 22     Temp 08/18/21 1713 98.1 F (36.7 C)     Temp src --      SpO2 08/18/21 1713 96 %     Weight 08/18/21 1712 117 lb (53.1 kg)     Height 08/18/21 1712 5\' 6"  (1.676 m)     Head Circumference --      Peak Flow --      Pain Score 08/18/21 1712 7     Pain Loc --      Pain Edu? --      Excl. in GC? --     Most recent vital  signs: Vitals:   08/18/21 1713 08/18/21 1819  BP: 115/72 101/68  Pulse: (!) 109 (!) 102  Resp: (!) 22 (!) 25  Temp: 98.1 F (36.7 C)   SpO2: 96% 98%     General: Awake, no distress.  She is mildly tachypneic.  Somewhat barrel chested.  Appears to have just mild increased work of breathing CV:  Good peripheral perfusion.  Mild tachycardia rate about 10 5-1 10 Resp:  Normal effort except slightly tachypneic.  She does speak in full sentences and does not have hypoxia on room air saturation 98%.  She demonstrates mild expiratory wheezing and occasional dry cough.  No noted crackles or rhonchi on lung fields. Abd:  No distention.  Other:  No lower extremity swelling or thigh tenderness.   ED Results / Procedures / Treatments   Labs (all labs ordered are listed, but only abnormal results are displayed) Labs Reviewed  BASIC METABOLIC PANEL - Abnormal; Notable for the following components:      Result Value   Glucose, Bld 103 (*)  BUN <5 (*)    Creatinine, Ser 0.34 (*)    All other components within normal limits  CBC - Abnormal; Notable for the following components:   MCV 106.5 (*)    MCH 35.5 (*)    All other components within normal limits  POC URINE PREG, ED     EKG  Reviewed inter by me at 1810 Heart rate 115 QRS 80 QTc 460 Sinus tachycardia.  Anteroseptal Q No obvious acute frank ischemia   RADIOLOGY  Personally interpreted the patient's chest x-ray as hyperexpansion.  No infiltrate  DG Chest 2 View  Result Date: 08/18/2021 CLINICAL DATA:  Shortness of breath. EXAM: CHEST - 2 VIEW COMPARISON:  Chest radiograph 03/01/2021. FINDINGS: No consolidation. No visible pleural effusion or pneumothorax. Cardiomediastinal silhouette is within normal limits. No evidence of acute osseous abnormality. IMPRESSION: No evidence of acute cardiopulmonary disease. Electronically Signed   By: Feliberto Harts M.D.   On: 08/18/2021 17:33       PROCEDURES:  Critical Care  performed: No  Procedures   MEDICATIONS ORDERED IN ED: Medications  predniSONE (DELTASONE) tablet 60 mg (has no administration in time range)     IMPRESSION / MDM / ASSESSMENT AND PLAN / ED COURSE  I reviewed the triage vital signs and the nursing notes.                              Differential diagnosis includes, but is not limited to, COPD exacerbation, given chest tightness potential ACS though this seems unlikely, pulmonary embolism also a consideration given her tachycardia and tachypnea, evaluate and exclude pneumonia, possible upper respiratory infection acute bronchitis, chronic bronchitis secondary to ongoing nicotine use etc.    Patient's presentation is most consistent with severe exacerbation of chronic illness.  The patient is on the cardiac monitor to evaluate for evidence of arrhythmia and/or significant heart rate changes.  ----------------------------------------- 6:55 PM on 08/18/2021 ----------------------------------------- Patient I had a discussion on her goals of care, she is alert and well oriented and has capacity to make a decision and tells me that she wished to be discharged.  She advises that she is unwilling to stay for further treatment, I discussed with her specifically that I feel like she is having a attack of her COPD that needs additional treatment for improvement before we decide if it would be safe to discharge her and also I wish to make sure she does not have other cause such as heart attack or blood clot in your lungs that could otherwise be deadly.  Patient understands that her leaving the ER will be against my strong advice to continue further treatment and care, but nonetheless she has capacity and understands the risks.  She is willing to take prescriptions and start prednisone prior to discharge, but unwilling to stay for further treatment.  She understands and I strongly encouraged her to come back for further treatment when able.   Discussed very careful return precautions, and she understands discharging AGAINST MEDICAL ADVICE.  I believe that she certainly understands the risks of discharge including potential death or major medical event that could have been prevented had she stated including possible severe COPD, respiratory distress, heart attack blood clot in the lungs or other emergent cause.  Return precautions and strong recommendation to stay for further treatment and follow-up with physician/pulmonologist as soon as possible discussed with the patient who is agreeable with the plan.  FINAL CLINICAL IMPRESSION(S) / ED DIAGNOSES   Final diagnoses:  Shortness of breath     Rx / DC Orders   ED Discharge Orders          Ordered    Ambulatory referral to Pulmonology       Comments: Dyspnea, left ER against medical advise, prompt follow-up requested   08/18/21 1847    albuterol (VENTOLIN HFA) 108 (90 Base) MCG/ACT inhaler  Every 6 hours PRN        08/18/21 1850    ipratropium (ATROVENT HFA) 17 MCG/ACT inhaler  4 times daily        08/18/21 1850    predniSONE (DELTASONE) 20 MG tablet  Daily with breakfast        08/18/21 1850    azithromycin (ZITHROMAX) 250 MG tablet        08/18/21 1851             Note:  This document was prepared using Dragon voice recognition software and may include unintentional dictation errors.   Sharyn Creamer, MD 08/18/21 [phone]

## 2021-10-30 ENCOUNTER — Other Ambulatory Visit: Payer: Self-pay

## 2021-10-30 ENCOUNTER — Emergency Department: Payer: Self-pay

## 2021-10-30 ENCOUNTER — Emergency Department
Admission: EM | Admit: 2021-10-30 | Discharge: 2021-10-30 | Disposition: A | Discharge status: Home / Self Care | Payer: Self-pay | Attending: Emergency Medicine | Admitting: Emergency Medicine

## 2021-10-30 ENCOUNTER — Encounter: Payer: Self-pay | Admitting: Radiology

## 2021-10-30 DIAGNOSIS — R000 Tachycardia, unspecified: Secondary | ICD-10-CM | POA: Insufficient documentation

## 2021-10-30 DIAGNOSIS — J449 Chronic obstructive pulmonary disease, unspecified: Secondary | ICD-10-CM | POA: Insufficient documentation

## 2021-10-30 DIAGNOSIS — K0381 Cracked tooth: Secondary | ICD-10-CM | POA: Insufficient documentation

## 2021-10-30 DIAGNOSIS — J189 Pneumonia, unspecified organism: Secondary | ICD-10-CM | POA: Insufficient documentation

## 2021-10-30 DIAGNOSIS — D72829 Elevated white blood cell count, unspecified: Secondary | ICD-10-CM | POA: Insufficient documentation

## 2021-10-30 LAB — BASIC METABOLIC PANEL
Anion gap: 17 — ABNORMAL HIGH (ref 5–15)
BUN: <5 mg/dL — ABNORMAL LOW (ref 6–20)
CO2: 19 mmol/L — ABNORMAL LOW (ref 22–32)
Calcium: 9.6 mg/dL (ref 8.9–10.3)
Chloride: 99 mmol/L (ref 98–111)
Creatinine, Ser: 0.46 mg/dL (ref 0.44–1.00)
GFR, Estimated: >60 mL/min (ref >60)
Glucose, Bld: 111 mg/dL — ABNORMAL HIGH (ref 70–99)
Potassium: 3.2 mmol/L — ABNORMAL LOW (ref 3.5–5.1)
Sodium: 135 mmol/L (ref 135–145)

## 2021-10-30 LAB — POC URINE PREG, ED: Preg Test, Ur: NEGATIVE

## 2021-10-30 LAB — TROPONIN I (HIGH SENSITIVITY): Troponin I (High Sensitivity): 5 ng/L (ref <18)

## 2021-10-30 LAB — CBC
HCT: 46 % (ref 36.0–46.0)
Hemoglobin: 16.2 g/dL — ABNORMAL HIGH (ref 12.0–15.0)
MCH: 35.5 pg — ABNORMAL HIGH (ref 26.0–34.0)
MCHC: 35.2 g/dL (ref 30.0–36.0)
MCV: 100.9 fL — ABNORMAL HIGH (ref 80.0–100.0)
Platelets: 160 10*3/uL (ref 150–400)
RBC: 4.56 MIL/uL (ref 3.87–5.11)
RDW: 12.6 % (ref 11.5–15.5)
WBC: 15.5 10*3/uL — ABNORMAL HIGH (ref 4.0–10.5)
nRBC: 0 % (ref 0.0–0.2)

## 2021-10-30 MED ORDER — CEPHALEXIN 500 MG PO CAPS: 500.0000 mg | ORAL_CAPSULE | Freq: Three times a day (TID) | ORAL | 0 refills | Status: DC

## 2021-10-30 MED ORDER — ONDANSETRON HCL 4 MG/2ML IJ SOLN
4.0000 mg | Freq: Once | INTRAMUSCULAR | Status: AC
  Administered 2021-10-30: 4 mg via INTRAVENOUS
  Filled 2021-10-30: qty 2

## 2021-10-30 MED ORDER — IPRATROPIUM-ALBUTEROL 0.5-2.5 (3) MG/3ML IN SOLN
3.0000 mL | Freq: Once | RESPIRATORY_TRACT | Status: AC
Start: 2021-10-30 — End: 2021-10-30
  Administered 2021-10-30: 3 mL via RESPIRATORY_TRACT
  Filled 2021-10-30: qty 3

## 2021-10-30 MED ORDER — SODIUM CHLORIDE 0.9 % IV BOLUS
1000.0000 mL | Freq: Once | INTRAVENOUS | Status: AC
  Administered 2021-10-30: 1000 mL via INTRAVENOUS

## 2021-10-30 MED ORDER — AZITHROMYCIN 250 MG PO TABS: ORAL_TABLET | ORAL | 0 refills | Status: DC

## 2021-10-30 MED ORDER — IOHEXOL 350 MG/ML SOLN
75.0000 mL | Freq: Once | INTRAVENOUS | Status: AC | PRN
  Administered 2021-10-30: 75 mL via INTRAVENOUS

## 2021-10-30 MED ORDER — AZITHROMYCIN 200 MG/5ML PO SUSR: 500.0000 mg | Freq: Once | ORAL | Status: DC

## 2021-10-30 MED ORDER — CEPHALEXIN 250 MG/5ML PO SUSR
500.0000 mg | Freq: Once | ORAL | Status: DC
  Filled 2021-10-30: qty 10

## 2021-10-30 MED ORDER — ONDANSETRON 4 MG PO TBDP: 4.0000 mg | ORAL_TABLET | Freq: Three times a day (TID) | ORAL | 0 refills | Status: DC | PRN

## 2021-10-30 MED ORDER — AZITHROMYCIN 500 MG PO TABS
500.0000 mg | ORAL_TABLET | Freq: Once | ORAL | Status: AC
  Administered 2021-10-30: 500 mg via ORAL
  Filled 2021-10-30: qty 1

## 2021-10-30 MED ORDER — CEPHALEXIN 250 MG/5ML PO SUSR: 500.0000 mg | Freq: Three times a day (TID) | ORAL | 0 refills | Status: AC

## 2021-10-30 MED ORDER — LORAZEPAM 2 MG/ML IJ SOLN
1.0000 mg | Freq: Once | INTRAMUSCULAR | Status: AC
  Administered 2021-10-30: 1 mg via INTRAVENOUS
  Filled 2021-10-30: qty 1

## 2021-10-30 MED ORDER — METHYLPREDNISOLONE SODIUM SUCC 125 MG IJ SOLR
125.0000 mg | Freq: Once | INTRAMUSCULAR | Status: AC
  Administered 2021-10-30: 125 mg via INTRAVENOUS
  Filled 2021-10-30: qty 2

## 2021-10-30 MED ORDER — GUAIFENESIN-CODEINE 100-10 MG/5ML PO SOLN: 5.0000 mL | Freq: Four times a day (QID) | ORAL | 0 refills | Status: DC | PRN

## 2021-10-30 MED ORDER — AZITHROMYCIN 200 MG/5ML PO SUSR: 250.0000 mg | Freq: Every day | ORAL | 0 refills | Status: AC

## 2021-10-30 MED ORDER — CEPHALEXIN 500 MG PO CAPS
500.0000 mg | ORAL_CAPSULE | Freq: Once | ORAL | Status: AC
  Administered 2021-10-30: 500 mg via ORAL
  Filled 2021-10-30: qty 1

## 2021-10-30 MED ORDER — IPRATROPIUM-ALBUTEROL 0.5-2.5 (3) MG/3ML IN SOLN
3.0000 mL | Freq: Once | RESPIRATORY_TRACT | Status: AC
  Administered 2021-10-30: 3 mL via RESPIRATORY_TRACT
  Filled 2021-10-30: qty 3

## 2021-10-30 NOTE — ED Provider Notes (Signed)
[facility] Provider Note    Event Date/Time   First MD Initiated Contact with Patient 10/30/21 [phone]     (approximate)  History   Chief Complaint: Shortness of Breath and Chest Pain  HPI  Kelly Patterson is a 36 y.o. female with a past medical history of COPD who presents to the emergency department for shortness of breath and chest discomfort.  According to the patient and her mother for the past 1 week or so patient has been experiencing shortness of breath nausea and chest pain.  Patient has a history of COPD, currently sitting upright in bed complaining of shortness of breath and occasionally dry heaving into an emesis bag.  Patient also states a history of poor dentition and dental pain which has been bothering her worse for the past 1 week.  Physical Exam   Triage Vital Signs: ED Triage Vitals  Enc Vitals Group     BP 10/30/21 0807 (!) 135/116     Pulse Rate 10/30/21 0807 (!) 148     Resp 10/30/21 0807 (!) 22     Temp 10/30/21 0807 98.8 F (37.1 C)     Temp Source 10/30/21 0807 Oral     SpO2 10/30/21 0807 99 %     Weight 10/30/21 0804 117 lb 1 oz (53.1 kg)     Height 10/30/21 0804 5\' 6"  (1.676 m)     Head Circumference --      Peak Flow --      Pain Score 10/30/21 0804 10     Pain Loc --      Pain Edu? --      Excl. in GC? --     Most recent vital signs: Vitals:   10/30/21 0807  BP: (!) 135/116  Pulse: (!) 148  Resp: (!) 22  Temp: 98.8 F (37.1 C)  SpO2: 99%    General: Awake, sitting up in bed mild distress. CV:  Good peripheral perfusion.  Regular rhythm rate around 140 bpm. Resp:  Mild tachypnea with mild expiratory wheezes bilaterally. Abd:  No distention.  Soft, nontender.  No rebound or guarding. Other:  Poor dentition and tooth fractures throughout but no signs of any obvious abscess.   ED Results / Procedures / Treatments   EKG  EKG viewed and interpreted by myself shows sinus tachycardia at 153 bpm with a narrow QRS,  normal axis, normal intervals, nonspecific ST changes  RADIOLOGY  I have reviewed and interpreted the x-ray images I do not see any obvious significant abnormality on my evaluation. Radiology is read the CT as an ill-defined opacity in the right midlung. CTA shows what appears to be most consistent with pneumonia in the right midlung though recommend repeat CT in 3 months to ensure no mass.  Negative for PE.   MEDICATIONS ORDERED IN ED: Medications  ipratropium-albuterol (DUONEB) 0.5-2.5 (3) MG/3ML nebulizer solution 3 mL (has no administration in time range)  ipratropium-albuterol (DUONEB) 0.5-2.5 (3) MG/3ML nebulizer solution 3 mL (has no administration in time range)  methylPREDNISolone sodium succinate (SOLU-MEDROL) 125 mg/2 mL injection 125 mg (has no administration in time range)  LORazepam (ATIVAN) injection 1 mg (has no administration in time range)     IMPRESSION / MDM / ASSESSMENT AND PLAN / ED COURSE  I reviewed the triage vital signs and the nursing notes.  Patient's presentation is most consistent with acute presentation with potential threat to life or bodily function.  Patient presents to the emergency department for  worsening shortness of breath intermittent nausea and chest discomfort over the past 1 week.  Currently patient appears quite anxious, sitting upright in bed with mild respiratory distress tachypnea and occasional dry heaving.  Patient has a history of COPD but no O2 requirement at baseline currently satting 99 to 100%.  Tachycardic around 140 bpm what appears to be sinus rhythm.  We will dose DuoNebs x2, Solu-Medrol.  We will also dose Ativan.  We will check labs including cardiac enzymes we will obtain a chest x-ray.  EKG shows tachycardia but no other concerning finding.  Patient's work-up in the emergency department shows negative troponin, mild leukocytosis of 15,000, overall reassuring chemistry besides some dehydration patient has received a liter of fluid.   Pregnancy test negative.  Chest x-ray concerning for right middle lobe pneumonia versus mass we will proceed with CTA imaging given the patient's continued tachycardia.  CTA is negative for PE but does show a 3.9 cm mass in the right midlung field most consistent with pneumonia.  Patient continues to be tachycardic with occasional nausea.  I discussed the results with the patient and mother and strongly recommended admission to the hospital for IV antibiotics.  Patient is adamantly against admission and states she wants to go home.  Patient states she hates being in the hospital.  Patient's work-up is concerning for pneumonia which also meets sepsis criteria.  Discussed with the patient that if she wants to leave we would need her to sign out AGAINST MEDICAL ADVICE which the patient is willing to do.  We will still prescribe the patient antibiotics Zithromax and Keflex as well as a nausea medication.  I also discussed with the patient the need to follow-up for repeat CT scan in 3 months to ensure that there is no mass or tumor underlying the infectious process.  Patient understands the risk and still wishes to be discharged home.  FINAL CLINICAL IMPRESSION(S) / ED DIAGNOSES   Dyspnea Chest pain Right-sided pneumonia  Note:  This document was prepared using Dragon voice recognition software and may include unintentional dictation errors.   Minna Antis, MD 10/30/21 1041

## 2021-10-30 NOTE — ED Triage Notes (Signed)
Pt here with SOB and cp that started last night. Pt states pain is right sided and radiates to her back. Pt states pain is sharp and is constant when she is moving. Pt here with mother.

## 2021-10-30 NOTE — ED Notes (Signed)
Provider as well as this nurse spoke with patient about risk of leaving AMA. Pt understands and still wants to. AMA form signed.

## 2021-11-26 ENCOUNTER — Encounter: Payer: Self-pay | Admitting: Emergency Medicine

## 2021-11-26 ENCOUNTER — Inpatient Hospital Stay
Admission: EM | Admit: 2021-11-26 | Discharge: 2021-11-27 | DRG: 190 | Discharge status: Against Medical Advice | Payer: Self-pay | Attending: Internal Medicine | Admitting: Internal Medicine

## 2021-11-26 ENCOUNTER — Other Ambulatory Visit: Payer: Self-pay

## 2021-11-26 ENCOUNTER — Emergency Department: Payer: Self-pay

## 2021-11-26 DIAGNOSIS — Z20822 Contact with and (suspected) exposure to covid-19: Secondary | ICD-10-CM | POA: Diagnosis present

## 2021-11-26 DIAGNOSIS — D7589 Other specified diseases of blood and blood-forming organs: Secondary | ICD-10-CM | POA: Diagnosis present

## 2021-11-26 DIAGNOSIS — J9601 Acute respiratory failure with hypoxia: Secondary | ICD-10-CM | POA: Diagnosis present

## 2021-11-26 DIAGNOSIS — A419 Sepsis, unspecified organism: Secondary | ICD-10-CM | POA: Diagnosis present

## 2021-11-26 DIAGNOSIS — Z79899 Other long term (current) drug therapy: Secondary | ICD-10-CM

## 2021-11-26 DIAGNOSIS — J1282 Pneumonia due to coronavirus disease 2019: Secondary | ICD-10-CM | POA: Insufficient documentation

## 2021-11-26 DIAGNOSIS — J189 Pneumonia, unspecified organism: Secondary | ICD-10-CM | POA: Diagnosis present

## 2021-11-26 DIAGNOSIS — F1010 Alcohol abuse, uncomplicated: Secondary | ICD-10-CM | POA: Diagnosis present

## 2021-11-26 DIAGNOSIS — U071 COVID-19: Secondary | ICD-10-CM

## 2021-11-26 DIAGNOSIS — F17210 Nicotine dependence, cigarettes, uncomplicated: Secondary | ICD-10-CM | POA: Diagnosis present

## 2021-11-26 DIAGNOSIS — Z681 Body mass index (BMI) 19 or less, adult: Secondary | ICD-10-CM

## 2021-11-26 DIAGNOSIS — I10 Essential (primary) hypertension: Secondary | ICD-10-CM | POA: Diagnosis present

## 2021-11-26 DIAGNOSIS — Z8249 Family history of ischemic heart disease and other diseases of the circulatory system: Secondary | ICD-10-CM

## 2021-11-26 DIAGNOSIS — R636 Underweight: Secondary | ICD-10-CM | POA: Diagnosis present

## 2021-11-26 DIAGNOSIS — F17213 Nicotine dependence, cigarettes, with withdrawal: Secondary | ICD-10-CM

## 2021-11-26 DIAGNOSIS — Z5329 Procedure and treatment not carried out because of patient's decision for other reasons: Secondary | ICD-10-CM | POA: Diagnosis not present

## 2021-11-26 DIAGNOSIS — Z8616 Personal history of COVID-19: Secondary | ICD-10-CM

## 2021-11-26 DIAGNOSIS — R6520 Severe sepsis without septic shock: Secondary | ICD-10-CM | POA: Diagnosis present

## 2021-11-26 DIAGNOSIS — J441 Chronic obstructive pulmonary disease with (acute) exacerbation: Secondary | ICD-10-CM | POA: Diagnosis present

## 2021-11-26 DIAGNOSIS — M1990 Unspecified osteoarthritis, unspecified site: Secondary | ICD-10-CM | POA: Diagnosis present

## 2021-11-26 DIAGNOSIS — Z803 Family history of malignant neoplasm of breast: Secondary | ICD-10-CM

## 2021-11-26 DIAGNOSIS — E079 Disorder of thyroid, unspecified: Secondary | ICD-10-CM | POA: Diagnosis present

## 2021-11-26 DIAGNOSIS — E872 Acidosis, unspecified: Secondary | ICD-10-CM | POA: Diagnosis present

## 2021-11-26 DIAGNOSIS — F17200 Nicotine dependence, unspecified, uncomplicated: Secondary | ICD-10-CM | POA: Diagnosis present

## 2021-11-26 DIAGNOSIS — J440 Chronic obstructive pulmonary disease with acute lower respiratory infection: Principal | ICD-10-CM | POA: Diagnosis present

## 2021-11-26 HISTORY — DX: Essential (primary) hypertension: I10

## 2021-11-26 HISTORY — DX: Disorder of thyroid, unspecified: E07.9

## 2021-11-26 HISTORY — DX: Unspecified osteoarthritis, unspecified site: M19.90

## 2021-11-26 HISTORY — DX: Pneumonia due to coronavirus disease 2019: J12.82

## 2021-11-26 LAB — TROPONIN I (HIGH SENSITIVITY)
Troponin I (High Sensitivity): 6 ng/L (ref <18)
Troponin I (High Sensitivity): 4 ng/L (ref <18)

## 2021-11-26 LAB — COMPREHENSIVE METABOLIC PANEL
ALT: 19 U/L (ref 0–44)
AST: 33 U/L (ref 15–41)
Albumin: 4.1 g/dL (ref 3.5–5.0)
Alkaline Phosphatase: 72 U/L (ref 38–126)
Anion gap: 10 (ref 5–15)
BUN: <5 mg/dL — ABNORMAL LOW (ref 6–20)
CO2: 24 mmol/L (ref 22–32)
Calcium: 9.4 mg/dL (ref 8.9–10.3)
Chloride: 106 mmol/L (ref 98–111)
Creatinine, Ser: 0.34 mg/dL — ABNORMAL LOW (ref 0.44–1.00)
GFR, Estimated: >60 mL/min (ref >60)
Glucose, Bld: 119 mg/dL — ABNORMAL HIGH (ref 70–99)
Potassium: 3.6 mmol/L (ref 3.5–5.1)
Sodium: 140 mmol/L (ref 135–145)
Total Bilirubin: 0.8 mg/dL (ref 0.3–1.2)
Total Protein: 8.8 g/dL — ABNORMAL HIGH (ref 6.5–8.1)

## 2021-11-26 LAB — CBC WITH DIFFERENTIAL/PLATELET
Abs Immature Granulocytes: 0.05 10*3/uL (ref 0.00–0.07)
Basophils Absolute: 0 10*3/uL (ref 0.0–0.1)
Basophils Relative: 0 %
Eosinophils Absolute: 0.4 10*3/uL (ref 0.0–0.5)
Eosinophils Relative: 3 %
HCT: 46.8 % — ABNORMAL HIGH (ref 36.0–46.0)
Hemoglobin: 15.7 g/dL — ABNORMAL HIGH (ref 12.0–15.0)
Immature Granulocytes: 0 %
Lymphocytes Relative: 7 %
Lymphs Abs: 1 10*3/uL (ref 0.7–4.0)
MCH: 35.7 pg — ABNORMAL HIGH (ref 26.0–34.0)
MCHC: 33.5 g/dL (ref 30.0–36.0)
MCV: 106.4 fL — ABNORMAL HIGH (ref 80.0–100.0)
Monocytes Absolute: 0.7 10*3/uL (ref 0.1–1.0)
Monocytes Relative: 5 %
Neutro Abs: 11.8 10*3/uL — ABNORMAL HIGH (ref 1.7–7.7)
Neutrophils Relative %: 85 %
Platelets: 172 10*3/uL (ref 150–400)
RBC: 4.4 MIL/uL (ref 3.87–5.11)
RDW: 12.8 % (ref 11.5–15.5)
WBC: 14 10*3/uL — ABNORMAL HIGH (ref 4.0–10.5)
nRBC: 0 % (ref 0.0–0.2)

## 2021-11-26 LAB — URINALYSIS, ROUTINE W REFLEX MICROSCOPIC
Bilirubin Urine: NEGATIVE
Glucose, UA: NEGATIVE mg/dL
Hgb urine dipstick: NEGATIVE
Ketones, ur: NEGATIVE mg/dL
Leukocytes,Ua: NEGATIVE
Nitrite: NEGATIVE
Protein, ur: NEGATIVE mg/dL
Specific Gravity, Urine: >1.046 — ABNORMAL HIGH (ref 1.005–1.030)
pH: 5 (ref 5.0–8.0)

## 2021-11-26 LAB — PROCALCITONIN: Procalcitonin: <0.1 ng/mL

## 2021-11-26 LAB — STREP PNEUMONIAE URINARY ANTIGEN: Strep Pneumo Urinary Antigen: NEGATIVE

## 2021-11-26 LAB — SARS CORONAVIRUS 2 BY RT PCR: SARS Coronavirus 2 by RT PCR: NEGATIVE

## 2021-11-26 LAB — HCG, QUANTITATIVE, PREGNANCY: hCG, Beta Chain, Quant, S: <1 m[IU]/mL (ref <5)

## 2021-11-26 LAB — LACTIC ACID, PLASMA
Lactic Acid, Venous: 2.2 mmol/L (ref 0.5–1.9)
Lactic Acid, Venous: 1.8 mmol/L (ref 0.5–1.9)

## 2021-11-26 MED ORDER — SODIUM CHLORIDE 0.9 % IV SOLN
500.0000 mg | INTRAVENOUS | Status: DC
  Administered 2021-11-26: 500 mg via INTRAVENOUS
  Filled 2021-11-26 (×2): qty 5

## 2021-11-26 MED ORDER — LORAZEPAM 2 MG PO TABS: 0.0000 mg | ORAL_TABLET | Freq: Two times a day (BID) | ORAL | Status: DC

## 2021-11-26 MED ORDER — PANTOPRAZOLE SODIUM 40 MG PO TBEC
40.0000 mg | DELAYED_RELEASE_TABLET | Freq: Every day | ORAL | Status: DC
  Administered 2021-11-26: 40 mg via ORAL
  Filled 2021-11-26: qty 1

## 2021-11-26 MED ORDER — LACTATED RINGERS IV SOLN: INTRAVENOUS | Status: AC

## 2021-11-26 MED ORDER — LORAZEPAM 1 MG PO TABS: 1.0000 mg | ORAL_TABLET | ORAL | Status: DC | PRN

## 2021-11-26 MED ORDER — ENOXAPARIN SODIUM 40 MG/0.4ML IJ SOSY
40.0000 mg | PREFILLED_SYRINGE | INTRAMUSCULAR | Status: DC
  Administered 2021-11-26: 40 mg via SUBCUTANEOUS
  Filled 2021-11-26: qty 0.4

## 2021-11-26 MED ORDER — SODIUM CHLORIDE 0.45 % IV SOLN: INTRAVENOUS | Status: AC

## 2021-11-26 MED ORDER — MAGNESIUM SULFATE 2 GM/50ML IV SOLN
2.0000 g | Freq: Once | INTRAVENOUS | Status: AC
Start: 2021-11-26 — End: 2021-11-26
  Administered 2021-11-26: 2 g via INTRAVENOUS
  Filled 2021-11-26: qty 50

## 2021-11-26 MED ORDER — SODIUM CHLORIDE 0.9 % IV SOLN
2.0000 g | INTRAVENOUS | Status: DC
  Administered 2021-11-26: 2 g via INTRAVENOUS
  Filled 2021-11-26 (×2): qty 20

## 2021-11-26 MED ORDER — MORPHINE SULFATE (PF) 4 MG/ML IV SOLN
4.0000 mg | Freq: Once | INTRAVENOUS | Status: AC
  Administered 2021-11-26: 4 mg via INTRAVENOUS
  Filled 2021-11-26: qty 1

## 2021-11-26 MED ORDER — PREDNISONE 20 MG PO TABS
40.0000 mg | ORAL_TABLET | Freq: Every day | ORAL | Status: DC
  Administered 2021-11-27: 40 mg via ORAL
  Filled 2021-11-26: qty 2

## 2021-11-26 MED ORDER — FOLIC ACID 1 MG PO TABS
1.0000 mg | ORAL_TABLET | Freq: Every day | ORAL | Status: DC
  Administered 2021-11-26: 1 mg via ORAL
  Filled 2021-11-26: qty 1

## 2021-11-26 MED ORDER — ACETAMINOPHEN 325 MG PO TABS: 650.0000 mg | ORAL_TABLET | Freq: Four times a day (QID) | ORAL | Status: DC | PRN

## 2021-11-26 MED ORDER — THIAMINE MONONITRATE 100 MG PO TABS: 100.0000 mg | ORAL_TABLET | Freq: Every day | ORAL | Status: DC

## 2021-11-26 MED ORDER — OXYCODONE HCL 5 MG PO TABS
5.0000 mg | ORAL_TABLET | Freq: Once | ORAL | Status: AC
  Administered 2021-11-26: 5 mg via ORAL
  Filled 2021-11-26: qty 1

## 2021-11-26 MED ORDER — ONDANSETRON HCL 4 MG PO TABS: 4.0000 mg | ORAL_TABLET | Freq: Four times a day (QID) | ORAL | Status: DC | PRN

## 2021-11-26 MED ORDER — KETOROLAC TROMETHAMINE 15 MG/ML IJ SOLN
15.0000 mg | Freq: Four times a day (QID) | INTRAMUSCULAR | Status: AC
  Administered 2021-11-26 (×2): 15 mg via INTRAVENOUS
  Filled 2021-11-26 (×2): qty 1

## 2021-11-26 MED ORDER — ALBUTEROL SULFATE (2.5 MG/3ML) 0.083% IN NEBU: 2.5000 mg | INHALATION_SOLUTION | RESPIRATORY_TRACT | Status: DC | PRN

## 2021-11-26 MED ORDER — IPRATROPIUM-ALBUTEROL 0.5-2.5 (3) MG/3ML IN SOLN
3.0000 mL | Freq: Four times a day (QID) | RESPIRATORY_TRACT | Status: DC
Start: 2021-11-26 — End: 2021-11-27
  Administered 2021-11-26 – 2021-11-27 (×3): 3 mL via RESPIRATORY_TRACT
  Filled 2021-11-26 (×3): qty 3

## 2021-11-26 MED ORDER — THIAMINE HCL 100 MG/ML IJ SOLN
100.0000 mg | Freq: Every day | INTRAMUSCULAR | Status: DC
  Administered 2021-11-26: 100 mg via INTRAVENOUS
  Filled 2021-11-26: qty 2

## 2021-11-26 MED ORDER — ACETAMINOPHEN 650 MG RE SUPP: 650.0000 mg | Freq: Four times a day (QID) | RECTAL | Status: DC | PRN

## 2021-11-26 MED ORDER — METHYLPREDNISOLONE SODIUM SUCC 125 MG IJ SOLR
125.0000 mg | Freq: Once | INTRAMUSCULAR | Status: AC
  Administered 2021-11-26: 125 mg via INTRAVENOUS
  Filled 2021-11-26: qty 2

## 2021-11-26 MED ORDER — LORAZEPAM 2 MG/ML IJ SOLN: 1.0000 mg | INTRAMUSCULAR | Status: DC | PRN

## 2021-11-26 MED ORDER — LORAZEPAM 2 MG PO TABS
0.0000 mg | ORAL_TABLET | Freq: Four times a day (QID) | ORAL | Status: DC
  Administered 2021-11-26 (×2): 1 mg via ORAL
  Filled 2021-11-26 (×2): qty 1

## 2021-11-26 MED ORDER — HYDROMORPHONE HCL 1 MG/ML IJ SOLN
0.5000 mg | Freq: Once | INTRAMUSCULAR | Status: AC
  Administered 2021-11-26: 0.5 mg via INTRAVENOUS
  Filled 2021-11-26: qty 0.5

## 2021-11-26 MED ORDER — LACTATED RINGERS IV BOLUS
1000.0000 mL | Freq: Once | INTRAVENOUS | Status: AC
  Administered 2021-11-26: 1000 mL via INTRAVENOUS

## 2021-11-26 MED ORDER — POTASSIUM CHLORIDE CRYS ER 20 MEQ PO TBCR
40.0000 meq | EXTENDED_RELEASE_TABLET | Freq: Once | ORAL | Status: AC
  Administered 2021-11-26: 40 meq via ORAL
  Filled 2021-11-26: qty 2

## 2021-11-26 MED ORDER — ADULT MULTIVITAMIN W/MINERALS CH
1.0000 | ORAL_TABLET | Freq: Every day | ORAL | Status: DC
  Filled 2021-11-26: qty 1

## 2021-11-26 MED ORDER — NICOTINE 21 MG/24HR TD PT24
21.0000 mg | MEDICATED_PATCH | Freq: Every day | TRANSDERMAL | Status: DC
  Administered 2021-11-26: 21 mg via TRANSDERMAL
  Filled 2021-11-26: qty 1

## 2021-11-26 MED ORDER — ONDANSETRON HCL 4 MG/2ML IJ SOLN
4.0000 mg | Freq: Four times a day (QID) | INTRAMUSCULAR | Status: DC | PRN
  Administered 2021-11-26: 4 mg via INTRAVENOUS
  Filled 2021-11-26: qty 2

## 2021-11-26 MED ORDER — IPRATROPIUM-ALBUTEROL 0.5-2.5 (3) MG/3ML IN SOLN
3.0000 mL | Freq: Once | RESPIRATORY_TRACT | Status: AC
  Administered 2021-11-26: 3 mL via RESPIRATORY_TRACT
  Filled 2021-11-26: qty 3

## 2021-11-26 MED ORDER — IOHEXOL 350 MG/ML SOLN
75.0000 mL | Freq: Once | INTRAVENOUS | Status: AC | PRN
  Administered 2021-11-26: 75 mL via INTRAVENOUS

## 2021-11-26 NOTE — Sepsis Progress Note (Signed)
Code sepsis protocol being monitored by eLink. 

## 2021-11-26 NOTE — TOC Initial Note (Signed)
Transition of Care [facility]) - Initial/Assessment Note    Patient Details  Name: Kelly Patterson MRN: [id number] Date of Birth: [birth year]  Transition of [facility]) CM/SW Contact:    Durenda Guthrie, RN Phone Number: 11/26/2021, 3:17 PM  Clinical Narrative:                 Transition of Care Department [facility]) has reviewed patient and no TOC needs have been identified at this time. We will continue to monitor patient advancement through Interdisciplinary progressions. If new patient transition needs arise, please place a consult.         Patient Goals and CMS Choice        Expected Discharge Plan and Services                                                Prior Living Arrangements/Services                       Activities of Daily Living Home Assistive Devices/Equipment: None ADL Screening (condition at time of admission) Patient's cognitive ability adequate to safely complete daily activities?: Yes Is the patient deaf or have difficulty hearing?: No Does the patient have difficulty seeing, even when wearing glasses/contacts?: No Does the patient have difficulty concentrating, remembering, or making decisions?: No Patient able to express need for assistance with ADLs?: Yes Does the patient have difficulty dressing or bathing?: No Independently performs ADLs?: Yes (appropriate for developmental age) Does the patient have difficulty walking or climbing stairs?: No Weakness of Legs: None Weakness of Arms/Hands: None  Permission Sought/Granted                  Emotional Assessment              Admission diagnosis:  CAP (community acquired pneumonia) [J18.9] Pneumonia of right lower lobe due to infectious organism [J18.9] Sepsis with acute hypoxic respiratory failure without septic shock, due to unspecified organism (HCC) [A41.9, R65.20, J96.01] Patient Active Problem List   Diagnosis Date Noted   Sepsis (HCC) 09/07/2020   CAP (community  acquired pneumonia) 09/07/2020   Nicotine dependence 09/07/2020   Alcohol abuse 09/07/2020   Acute respiratory failure (HCC) 08/26/2020   PCP:  Patient, No Pcp Per Pharmacy:   [facility] [address] ([address]), [place] - [address] [place] ([place]) [place] [address] Phone: [phone] Fax: [phone]  [id number] [place], [place] - [address] AT [facility] [address] [place] [address] Phone: [phone] Fax: [phone]     Social Determinants of Health (SDOH) Interventions    Readmission Risk Interventions     No data to display

## 2021-11-26 NOTE — ED Triage Notes (Signed)
Pt here for [facility]. Pt labored and tachypnea present in triage.  Tachy and hypoxic.  Placed on 3 L [place] and sats increased to 91 %, increased to 4 L.  Mom reports sats are normal despite COPD. Finished abx for PNA 1 week ago.  Having pain to right back/chest, side that PNA was on.  Sats dropped back in upper 80s on 4 L, increased to 5 L [place] while triaging.  No fever currently.

## 2021-11-26 NOTE — ED Notes (Signed)
Pt provided with shasta per request. Pt informed of need for urine sample. Pt verbalized understanding. Oxygen titrated from 3.5L [place] to 2.5L [place]. Respirations are even and unlabored, skin AfE/WD. VSS at this time. Call light is in reach. WCTM. Family is at bedside.

## 2021-11-26 NOTE — Plan of Care (Signed)

## 2021-11-26 NOTE — H&P (Signed)
History and Physical    Patient: Kelly Patterson VQQ:[id number] DOB: October 17, 1985 DOA: 11/26/2021 DOS: the patient was seen and examined on 11/26/2021 PCP: Patient, No Pcp Per  Patient coming from: Home  Chief Complaint:  Chief Complaint  Patient presents with   Shortness of Breath   HPI: Kelly Patterson is a 36 y.o. female with medical history significant of osteoarthritis, COPD, COVID-pneumonia, history of community-acquired pneumonia x2, hypertension, unspecified thyroid disease, tobacco abuse (2 PPD), alcohol abuse (8-12 beers daily) who is coming to emergency department due to progressively worsening dyspnea associated with right lower chest wall pleuritic pain.  She stated she has been having respiratory problems since she had COVID a year and a half ago.  She was treated for pneumonia 1 month ago, took 2 antibiotics but did not improve significantly.  She has been wheezing on occasion.  She has productive cough of clear/greenish sputum for the past several days associated with fatigue, malaise and decreased appetite.  No fever, chills, but positive night sweats, which she has been getting for years now.  Her appetite is decreased.  No sore throat, rhinorrhea or hemoptysis.  No chest pain, palpitations, diaphoresis, PND, orthopnea or pitting edema of the lower extremities.  No abdominal pain, diarrhea, constipation, melena or hematochezia.  No flank pain, dysuria, frequency or hematuria.  No polyuria, polydipsia, polyphagia or blurred vision.  ED course: Initial vital signs were temperature 98.3 F, pulse 122, respiration 28, BP 127/68 mmHg and O2 sat 83% on room air.  She required nasal cannula oxygen 4-5 LPM, for a while, but is satting currently 97% on room air 7 hours after arrival to the emergency department.  She received ceftriaxone, azithromycin, bronchodilators, hydromorphone 0.5 mg IVP x1 methylprednisolone 125 mg IVP, morphine 4 mg IVP and 1 tablet of oxycodone 5 mg IVP.  However, the  patient had an episode of emesis following oxycodone administration.  I added magnesium sulfate 2 g IV and KCl 40 mEq p.o. x1.  Lab work: Urinalysis show increase of specific gravity of more than 1.046, but was otherwise normal.  Troponin x2, procalcitonin and coronavirus PCR were negative.  Lactic acid 2.2 then 1.8 mmol/L.  CBC*white count 14.0 with 85% neutrophils, hemoglobin 15.7 g/dL with an MCV of 433.2 fL and platelets 172.  CMP showed a glucose of 119, BUN less than 5, creatinine 0.34 mg deciliter and a total protein of 8.8 g/dL.  The rest of the CMP values were normal.  Imaging: Portable 1 view chest/CTA chest with no definite PE.  There was interval development of a large right lower lobe airspace opacity, right middle lobe atelectasis and mild left posterior basilar subsegmental atelectasis or infiltrate.   Review of Systems: As mentioned in the history of present illness. All other systems reviewed and are negative. Past Medical History:  Diagnosis Date   Arthritis    COPD (chronic obstructive pulmonary disease) (HCC)    Hypertension    Thyroid disease    History reviewed. No pertinent surgical history. Social History:  reports that she has been smoking cigarettes. She has been smoking an average of 1.5 packs per day. She has never used smokeless tobacco. She reports current alcohol use of about 12.0 standard drinks of alcohol per week. She reports current drug use. Drug: Marijuana.  No Known Allergies  Family History  Problem Relation Age of Onset   Hypertension Mother    Breast cancer Other     Prior to Admission medications   Medication Sig  Start Date End Date Taking? Authorizing Provider  albuterol (VENTOLIN HFA) 108 (90 Base) MCG/ACT inhaler Inhale 2 puffs into the lungs every 6 (six) hours as needed for wheezing or shortness of breath. 08/18/21   Sharyn Creamer, MD  Fluticasone-Salmeterol 113-14 MCG/ACT AEPB Take 1 puff by mouth 2 (two) times daily. 08/23/21   [provider]  guaiFENesin-codeine 100-10 MG/5ML syrup Take 5 mLs by mouth every 6 (six) hours as needed for cough. 10/30/21   Minna Antis, MD  ipratropium (ATROVENT HFA) 17 MCG/ACT inhaler Inhale 2 puffs into the lungs 4 (four) times daily. 08/18/21 08/18/22  Sharyn Creamer, MD  ondansetron (ZOFRAN-ODT) 4 MG disintegrating tablet Take 1 tablet (4 mg total) by mouth every 8 (eight) hours as needed for nausea or vomiting. 10/30/21   Minna Antis, MD    Physical Exam: Vitals:   11/26/21 1120 11/26/21 1135 11/26/21 1147 11/26/21 1200  BP:   107/74 101/75  Pulse: 95 (!) 107 98 90  Resp: 20 18 20 18   Temp:   98.6 F (37 C)   TempSrc:   Oral   SpO2: 96% 93% 98% 98%  Weight:      Height:       Physical Exam Vitals and nursing note reviewed.  Constitutional:      Appearance: She is well-developed. She is ill-appearing. She is not toxic-appearing.  HENT:     Head: Normocephalic.     Mouth/Throat:     Mouth: Mucous membranes are moist.     Pharynx: No oropharyngeal exudate.  Eyes:     General: No scleral icterus.    Pupils: Pupils are equal, round, and reactive to light.  Neck:     Vascular: No JVD.  Cardiovascular:     Rate and Rhythm: Normal rate and regular rhythm.     Heart sounds: S1 normal and S2 normal.  Pulmonary:     Effort: Pulmonary effort is normal.     Breath sounds: Examination of the right-lower field reveals decreased breath sounds and rales. Decreased breath sounds, rhonchi and rales present. No wheezing.  Abdominal:     General: Bowel sounds are normal. There is no distension.     Palpations: Abdomen is soft.     Tenderness: There is no abdominal tenderness. There is no guarding.  Musculoskeletal:     Cervical back: Neck supple.     Right lower leg: No edema.     Left lower leg: No edema.  Skin:    General: Skin is dry.     Comments: Multiple insect bites in different stages of healing.  Neurological:     General: No focal deficit present.     Mental  Status: She is alert and oriented to person, place, and time.  Psychiatric:        Mood and Affect: Mood is anxious.        Behavior: Behavior normal.   Data Reviewed:  There are no new results to review at this time.  Assessment and Plan: Principal Problem:   Acute respiratory failure (HCC) In the setting of:   CAP (community acquired pneumonia) With superimposed:   COPD with acute exacerbation (HCC) Admit to telemetry/inpatient. Continue supplemental oxygen PRN. Scheduled and as needed bronchodilators. Continue ceftriaxone 1 g IVPB daily. Continue azithromycin 500 mg IVPB daily. Check strep pneumoniae urinary antigen. Check sputum Gram stain, culture and sensitivity. Follow-up blood culture and sensitivity. Follow-up CBC and chemistry in the morning.  Active Problems:   Alcohol abuse CIWA protocol  with lorazepam. Magnesium sulfate supplementation. Folate, MVI and thiamine. Consult transitional care team.    Nicotine dependence NicoDerm patches daily. Tobacco cessation advised.    Macrocytosis In the setting of alcohol abuse. Continue folic acid supplementation.   Check B12 level.     Advance Care Planning:   Code Status: Full Code   Consults:   Family Communication:   Severity of Illness: The appropriate patient status for this patient is INPATIENT. Inpatient status is judged to be reasonable and necessary in order to provide the required intensity of service to ensure the patient's safety. The patient's presenting symptoms, physical exam findings, and initial radiographic and laboratory data in the context of their chronic comorbidities is felt to place them at high risk for further clinical deterioration. Furthermore, it is not anticipated that the patient will be medically stable for discharge from the hospital within 2 midnights of admission.   * I certify that at the point of admission it is my clinical judgment that the patient will require inpatient  hospital care spanning beyond 2 midnights from the point of admission due to high intensity of service, high risk for further deterioration and high frequency of surveillance required.*  Author: Reubin Milan, MD 11/26/2021 12:39 PM  For on call review [email or web address].   This document was prepared using Dragon voice recognition software and may contain some unintended transcription errors.

## 2021-11-26 NOTE — ED Provider Notes (Signed)
[facility] Provider Note    Event Date/Time   First MD Initiated Contact with Patient 11/26/21 [id number]     (approximate)   History   Chief Complaint Shortness of Breath   HPI  Kelly Patterson is a 36 y.o. female with past medical history of hypertension, alcohol abuse, and COPD who presents to the ED complaining of shortness of breath.  Patient reports that she was initially treated with azithromycin and Keflex for pneumonia earlier this month, signed out AMA at that time despite recommendation she be admitted for IV antibiotics.  She states that she has been feeling worse since then, with increasing pain in the right side of her chest.  She describes this pain as constant and worse when she goes to take a deep breath.  She has had a frequent productive cough and feels increasingly short of breath with the symptoms.  She is not aware of any fevers at home.  She arrives to the ED with her mother, who is requesting that she be admitted to the hospital.     Physical Exam   Triage Vital Signs: ED Triage Vitals  Enc Vitals Group     BP 11/26/21 0729 127/68     Pulse Rate 11/26/21 0729 (!) 122     Resp 11/26/21 0729 (!) 28     Temp 11/26/21 0729 98.3 F (36.8 C)     Temp Source 11/26/21 0729 Oral     SpO2 11/26/21 0729 (!) 83 %     Weight 11/26/21 0730 117 lb 1 oz (53.1 kg)     Height 11/26/21 0730 5\' 6"  (1.676 m)     Head Circumference --      Peak Flow --      Pain Score 11/26/21 0730 10     Pain Loc --      Pain Edu? --      Excl. in GC? --     Most recent vital signs: Vitals:   11/26/21 1147 11/26/21 1200  BP: 107/74 101/75  Pulse: 98 90  Resp: 20 18  Temp: 98.6 F (37 C)   SpO2: 98% 98%    Constitutional: Alert and oriented, ill-appearing. Eyes: Conjunctivae are normal. Head: Atraumatic. Nose: No congestion/rhinnorhea. Mouth/Throat: Mucous membranes are moist.  Cardiovascular: Tachycardic, regular rhythm. Grossly normal heart sounds.   2+ radial pulses bilaterally. Respiratory: Tachypneic with increased respiratory effort.  No retractions. Lungs with poor air movement throughout and faint expiratory wheezing noted. Gastrointestinal: Soft and nontender. No distention. Musculoskeletal: No lower extremity tenderness nor edema.  Neurologic:  Normal speech and language. No gross focal neurologic deficits are appreciated.    ED Results / Procedures / Treatments   Labs (all labs ordered are listed, but only abnormal results are displayed) Labs Reviewed  CBC WITH DIFFERENTIAL/PLATELET - Abnormal; Notable for the following components:      Result Value   WBC 14.0 (*)    Hemoglobin 15.7 (*)    HCT 46.8 (*)    MCV 106.4 (*)    MCH 35.7 (*)    Neutro Abs 11.8 (*)    All other components within normal limits  LACTIC ACID, PLASMA - Abnormal; Notable for the following components:   Lactic Acid, Venous 2.2 (*)    All other components within normal limits  COMPREHENSIVE METABOLIC PANEL - Abnormal; Notable for the following components:   Glucose, Bld 119 (*)    BUN <5 (*)    Creatinine, Ser 0.34 (*)  Total Protein 8.8 (*)    All other components within normal limits  SARS CORONAVIRUS 2 BY RT PCR  CULTURE, BLOOD (ROUTINE X 2)  CULTURE, BLOOD (ROUTINE X 2)  LACTIC ACID, PLASMA  HCG, QUANTITATIVE, PREGNANCY  PROCALCITONIN  URINALYSIS, ROUTINE W REFLEX MICROSCOPIC  POC URINE PREG, ED  TROPONIN I (HIGH SENSITIVITY)  TROPONIN I (HIGH SENSITIVITY)     EKG  ED ECG REPORT I, Chesley Noon, the attending physician, personally viewed and interpreted this ECG.   Date: 11/26/2021  EKG Time: 7:35  Rate: 109  Rhythm: sinus tachycardia  Axis: Normal  Intervals:none  ST&T Change: None  RADIOLOGY Chest x-ray reviewed and interpreted by me with right lower lobe infiltrate concerning for pneumonia.  PROCEDURES:  Critical Care performed: Yes, see critical care procedure note(s)  .Critical Care  Performed by: Chesley Noon, MD Authorized by: Chesley Noon, MD   Critical care provider statement:    Critical care time (minutes):  30   Critical care time was exclusive of:  Separately billable procedures and treating other patients and teaching time   Critical care was necessary to treat or prevent imminent or life-threatening deterioration of the following conditions:  Respiratory failure and sepsis   Critical care was time spent personally by me on the following activities:  Development of treatment plan with patient or surrogate, discussions with consultants, evaluation of patient's response to treatment, examination of patient, ordering and review of laboratory studies, ordering and review of radiographic studies, ordering and performing treatments and interventions, pulse oximetry, re-evaluation of patient's condition and review of old charts   I assumed direction of critical care for this patient from another provider in my specialty: no     Care discussed with: admitting provider      MEDICATIONS ORDERED IN ED: Medications  cefTRIAXone (ROCEPHIN) 2 g in sodium chloride 0.9 % 100 mL IVPB (0 g Intravenous Stopped 11/26/21 0947)  azithromycin (ZITHROMAX) 500 mg in sodium chloride 0.9 % 250 mL IVPB (0 mg Intravenous Stopped 11/26/21 1057)  oxyCODONE (Oxy IR/ROXICODONE) immediate release tablet 5 mg (has no administration in time range)  ipratropium-albuterol (DUONEB) 0.5-2.5 (3) MG/3ML nebulizer solution 3 mL (3 mLs Nebulization Given 11/26/21 0805)  methylPREDNISolone sodium succinate (SOLU-MEDROL) 125 mg/2 mL injection 125 mg (125 mg Intravenous Given 11/26/21 0802)  morphine (PF) 4 MG/ML injection 4 mg (4 mg Intravenous Given 11/26/21 0755)  lactated ringers bolus 1,000 mL (0 mLs Intravenous Stopped 11/26/21 0946)  HYDROmorphone (DILAUDID) injection 0.5 mg (0.5 mg Intravenous Given 11/26/21 0928)  iohexol (OMNIPAQUE) 350 MG/ML injection 75 mL (75 mLs Intravenous Contrast Given 11/26/21 1125)     IMPRESSION / MDM  / ASSESSMENT AND PLAN / ED COURSE  I reviewed the triage vital signs and the nursing notes.                              36 y.o. female with past medical history of hypertension, COPD, and alcohol abuse who presents to the ED with increasing right chest wall pain and difficulty breathing following diagnosis of pneumonia about 1 month ago.  Patient's presentation is most consistent with acute presentation with potential threat to life or bodily function.  Differential diagnosis includes, but is not limited to, pneumonia, sepsis, PE, ACS, pneumothorax, COPD exacerbation, CHF.  Patient ill-appearing, tachycardic and tachypneic on my assessment, noted to be hypoxic in triage.  O2 sats are improved with placement of 4 L nasal cannula,  patient also noted to have wheezing with poor air movement and we will treat with IV Solu-Medrol and breathing treatment.  I am concerned for worsening pneumonia and sepsis secondary to this.  Blood cultures and lactic acid drawn, we will hydrate with IV fluids and further assess with chest x-ray.  EKG shows sinus tachycardia with no ischemic changes, lower suspicion for ACS.  Chest x-ray appears concerning for pneumonia and patient started on Rocephin and azithromycin.  Labs are remarkable for leukocytosis, no significant electrolyte abnormality or AKI noted.  Troponin within normal limits and I doubt ACS.  CTA chest was performed and negative for PE, does redemonstrate significant right lower lobe pneumonia.  Patient does report feeling better on reassessment, has been able to be weaned down to 2 L nasal cannula.  Case discussed with hospitalist for admission.      FINAL CLINICAL IMPRESSION(S) / ED DIAGNOSES   Final diagnoses:  Sepsis with acute hypoxic respiratory failure without septic shock, due to unspecified organism [facility])  Pneumonia of right lower lobe due to infectious organism     Rx / DC Orders   ED Discharge Orders     None        Note:  This  document was prepared using Dragon voice recognition software and may include unintentional dictation errors.   Chesley Noon, MD 11/26/21 [id number]

## 2021-11-26 NOTE — Progress Notes (Signed)
CODE SEPSIS - PHARMACY COMMUNICATION  **Broad Spectrum Antibiotics should be administered within 1 hour of Sepsis diagnosis**  Time Code Sepsis Called/Page Received: 0825  Antibiotics Ordered: Ceftriaxone and azithromycin   Time of 1st antibiotic administration: 0834  Additional action taken by pharmacy: N/A  If necessary, Name of Provider/Nurse Contacted: N/A   Gardner Candle, PharmD, BCPS Clinical Pharmacist 11/26/2021 8:28 AM

## 2021-11-26 NOTE — ED Notes (Signed)
Patient asked for something to drink. Was informed we are waiting on the results of her CT

## 2021-11-27 ENCOUNTER — Other Ambulatory Visit: Payer: Self-pay

## 2021-11-27 DIAGNOSIS — R6520 Severe sepsis without septic shock: Secondary | ICD-10-CM

## 2021-11-27 DIAGNOSIS — A419 Sepsis, unspecified organism: Secondary | ICD-10-CM

## 2021-11-27 MED ORDER — ADULT MULTIVITAMIN W/MINERALS CH
1.0000 | ORAL_TABLET | Freq: Every day | ORAL | 1 refills | Status: AC
  Filled 2021-11-27: qty 30, 30d supply, fill #0

## 2021-11-27 MED ORDER — LEVOFLOXACIN 500 MG PO TABS
500.0000 mg | ORAL_TABLET | Freq: Every day | ORAL | 0 refills | Status: DC
  Filled 2021-11-27: qty 10, 10d supply, fill #0

## 2021-11-27 MED ORDER — FOLIC ACID 1 MG PO TABS
1.0000 mg | ORAL_TABLET | Freq: Every day | ORAL | 2 refills | Status: AC
  Filled 2021-11-27: qty 30, 30d supply, fill #0

## 2021-11-27 MED ORDER — TRELEGY ELLIPTA 100-62.5-25 MCG/ACT IN AEPB
1.0000 | INHALATION_SPRAY | Freq: Every day | RESPIRATORY_TRACT | 3 refills | Status: AC
  Filled 2021-11-27: qty 60, 30d supply, fill #0

## 2021-11-27 MED ORDER — ALBUTEROL SULFATE HFA 108 (90 BASE) MCG/ACT IN AERS
2.0000 | INHALATION_SPRAY | Freq: Four times a day (QID) | RESPIRATORY_TRACT | 2 refills | Status: DC | PRN
  Filled 2021-11-27: qty 6.7, 25d supply, fill #0

## 2021-11-27 MED ORDER — IPRATROPIUM-ALBUTEROL 0.5-2.5 (3) MG/3ML IN SOLN
3.0000 mL | Freq: Three times a day (TID) | RESPIRATORY_TRACT | Status: DC
Start: 2021-11-27 — End: 2021-11-27

## 2021-11-27 MED ORDER — VITAMIN B-1 100 MG PO TABS
100.0000 mg | ORAL_TABLET | Freq: Every day | ORAL | 1 refills | Status: AC
Start: 2021-11-28 — End: ?
  Filled 2021-11-27: qty 30, 30d supply, fill #0

## 2021-11-27 MED ORDER — PREDNISONE 10 MG PO TABS
ORAL_TABLET | ORAL | 0 refills | Status: AC
  Filled 2021-11-27: qty 10, 4d supply, fill #0

## 2021-11-27 MED ORDER — PREDNISONE 10 MG PO TABS
ORAL_TABLET | ORAL | 0 refills | Status: DC
  Filled 2021-11-27: qty 11, 4d supply, fill #0

## 2021-11-27 MED ORDER — SODIUM CHLORIDE 0.9 % IV SOLN: INTRAVENOUS | Status: DC

## 2021-11-27 MED ORDER — LEVOFLOXACIN 500 MG PO TABS
500.0000 mg | ORAL_TABLET | Freq: Every day | ORAL | 0 refills | Status: AC
  Filled 2021-11-27: qty 5, 5d supply, fill #0

## 2021-11-27 NOTE — Assessment & Plan Note (Signed)
Resolved by following day.

## 2021-11-27 NOTE — Assessment & Plan Note (Signed)
Patient met criteria for sepsis on admission given pneumonia source, tachycardia, tachypnea, lactic acidosis and leukocytosis.  With IV fluids and antibiotics, lactic acid level resolved.  White blood cell count with mild improvement and patient then chose to leave [place].  Discharged on p.o. Levaquin.

## 2021-11-27 NOTE — Discharge Instructions (Signed)
Agency Name: [facility] Address: [address], [place], [place] [address] Phone: [phone] Website: www.[email or web address] Service(s) Offered: Housing services, self-sufficiency, congregate meal  program, and individual development account program.  Agency Name: [facility] Address: [address], [place], [place] [address] Phone: [phone] Email: info@[facility].org Website: www.[facility].org Service(s) Offered: Housing the homeless, feeding the hungry, community  kitchen & food pantry, job and education related services.  Agency Name: [facility] Address: [address], [place], [place] [address] Phone: [phone] Email: [email or web address] Service(s) Offered: Counseling, problem pregnancy, advocacy for Hispanics,  limited emergency financial assistance.  Agency Name: [employer] Address: [address], [place], [place] [address] Phone: [phone] Website: [email or web address] Service(s) Offered: Child support services; child welfare services; SNAPS;  Medicaid; work first family assistance; and aid with fuel,  rent, food and medicine.  Agency Name: [facility] Address: [address], [place], [place] [address] Phone: [phone] or [phone] Email: [email or web address] Service(s) Offered: Family services and transient assistance; emergency food,  fuel, clothing, limited furniture, utilities; budget counseling,  general counseling; give a kid a coat; thrift store; Christmas  food and toys. Utility assistance, food pantry, rental  assistance, life sustaining medicine  

## 2021-11-27 NOTE — Discharge Summary (Addendum)
Physician Discharge Summary   Patient: Kelly Patterson MRN: [id number] DOB: August 24, 1985  Admit date:     11/26/2021  Discharge date: 11/27/21  Discharge Physician: Annita Brod   PCP: Patient, No Pcp Per   Recommendations at discharge:   New medication: Prednisone taper New medication: Albuterol inhaler 2 puffs 4 times a day as needed New medication: Trelegy inhaler 1 puff daily New medication: Levaquin 500 p.o. daily x5 days Patient given [facility] Please note patient left AMA New medications: Thiamine, folate, multivitamin  Discharge Diagnoses: Principal Problem:   Severe sepsis ([place]) secondary to community-acquired pneumonia Active Problems:   Acute respiratory failure with hypoxia ([place])   COPD with acute exacerbation (H[facility])   Alcohol abuse   Macrocytosis   Underweight   Nicotine dependence  Resolved Problems:   * No resolved hospital problems. *  Hospital Course: 36 year old female with past medical history of hypertension, tobacco abuse, alcohol abuse drinking 8-12 beers a day and COPD who presented to the emergency room on 9/4 with complaints of shortness of breath right lower chest wall pleuritic pain.  Patient states that she has been having issues for the past year and a half since she had COVID and was treated for pneumonia 1 month prior.  Work-up revealed sepsis secondary to a right lower lobe pneumonia seen on chest x-ray and CT.  Patient started on IV fluids and antibiotics.  By hospital day 2, patient breathing a little more comfortably, able to be weaned down to room air.  She stated since she was feeling better, that she was going to leave no matter what.  Had extensive discussion with patient and was able to get medications for her to take home with her for medication management as well as referral to free clinic.  Patient states she will take these medications, however she still chose to leave AMA.  Assessment and Plan: * Severe sepsis  ([place]) secondary to community-acquired pneumonia Patient met criteria for sepsis on admission given pneumonia source, tachycardia, tachypnea, lactic acidosis and leukocytosis.  With IV fluids and antibiotics, lactic acid level resolved.  White blood cell count with mild improvement and patient then chose to leave [place].  Discharged on p.o. Levaquin.  Acute respiratory failure with hypoxia (HCC) Resolved by following day.  COPD with acute exacerbation ([place]) Improved from admission although patient left before further treatment could be given.  Was discharged on steroid taper plus rescue inhaler plus Trelegy.  Referred to free clinic.  Macrocytosis Secondary to heavy alcohol use.  Discharged on multivitamin plus folate and thiamine.  Alcohol abuse Monitor for withdrawals.  Patient left on following day  Underweight BMI of 18.89, likely consistent with poor nutritional intake due to alcohol use.  Consulted nutrition, but patient left before being able to be seen  Nicotine dependence Nicotine patch during hospitalization, and patient counseled.         Consultants: None Procedures performed: None Disposition: Home Diet recommendation:  Discharge Diet Orders (From admission, onward)     Start     Ordered   11/27/21 0000  Diet general        11/27/21 1058           Regular diet DISCHARGE MEDICATION: Allergies as of 11/27/2021   No Known Allergies      Medication List     STOP taking these medications    Fluticasone-Salmeterol 113-14 MCG/ACT Aepb   guaiFENesin-codeine 100-10 MG/5ML syrup   ipratropium 17 MCG/ACT inhaler  Commonly known as: ATROVENT HFA       TAKE these medications    albuterol 108 (90 Base) MCG/ACT inhaler Commonly known as: VENTOLIN HFA Inhale 2 puffs into the lungs every 6 (six) hours as needed for wheezing or shortness of breath.   folic acid 1 MG tablet Commonly known as: FOLVITE Take 1 tablet (1 mg total) by mouth  daily.   levofloxacin 500 MG tablet Commonly known as: Levaquin Take 1 tablet (500 mg total) by mouth daily for 5 days.   multivitamin with minerals Tabs tablet Take 1 tablet by mouth daily. Start taking on: November 28, 2021   ondansetron 4 MG disintegrating tablet Commonly known as: ZOFRAN-ODT Take 1 tablet (4 mg total) by mouth every 8 (eight) hours as needed for nausea or vomiting.   predniSONE 10 MG tablet Commonly known as: DELTASONE Take 4 tablets by mouth daily with breakfast for 1 day, then 3 tabs with breakfast for 1 day, then 2 tabs with breakfast for 1 day, then 1 tab with breakfast for 1 day. (Take 4 tablets (40 mg total) by mouth daily with breakfast for 1 day, THEN 3 tablets (30 mg total) daily with breakfast for 1 day, THEN 2 tablets (20 mg total) daily with breakfast for 1 day, THEN 1 tablet (10 mg total) daily with breakfast for 1 day.) Start taking on: November 28, 2021   thiamine 100 MG tablet Commonly known as: VITAMIN B1 Take 1 tablet (100 mg total) by mouth daily. Start taking on: November 28, 2021   Trelegy Ellipta 100-62.5-25 MCG/ACT Aepb Generic drug: Fluticasone-Umeclidin-Vilant Inhale 1 puff into the lungs daily.        Discharge Exam: Filed Weights   11/26/21 0730  Weight: 53.1 kg   General: Alert and oriented x3, anxious Cardiovascular: Regular rate and rhythm, S1-S2 Lungs: Decreased breath sounds throughout  Condition at discharge: fair  The results of significant diagnostics from this hospitalization (including imaging, microbiology, ancillary and laboratory) are listed below for reference.   Imaging Studies: CT Angio Chest PE W/Cm &/Or Wo Cm  Result Date: 11/26/2021 CLINICAL DATA:  Shortness of breath. EXAM: CT ANGIOGRAPHY CHEST WITH CONTRAST TECHNIQUE: Multidetector CT imaging of the chest was performed using the standard protocol during bolus administration of intravenous contrast. Multiplanar CT image reconstructions and MIPs were  obtained to evaluate the vascular anatomy. RADIATION DOSE REDUCTION: This exam was performed according to the departmental dose-optimization program which includes automated exposure control, adjustment of the mA and/or kV according to patient size and/or use of iterative reconstruction technique. CONTRAST:  17m OMNIPAQUE IOHEXOL 350 MG/ML SOLN COMPARISON:  October 30, 2021. FINDINGS: Cardiovascular: Satisfactory opacification of the pulmonary arteries to the segmental level. No evidence of pulmonary embolism. Normal heart size. No pericardial effusion. Mediastinum/Nodes: No enlarged mediastinal, hilar, or axillary lymph nodes. Thyroid gland, trachea, and esophagus demonstrate no significant findings. Lungs/Pleura: No pneumothorax or pleural effusion is noted. Interval development of large right lower lobe airspace opacity is noted most consistent with pneumonia. Mild left posterior basilar subsegmental atelectasis or infiltrate is noted. Right middle lobe atelectasis is noted. Upper Abdomen: No acute abnormality. Musculoskeletal: No chest wall abnormality. No acute or significant osseous findings. Review of the MIP images confirms the above findings. IMPRESSION: No definite evidence of pulmonary embolus. Interval development of large right lower lobe airspace opacity is noted consistent with pneumonia. Right middle lobe atelectasis is noted. Mild left posterior basilar subsegmental atelectasis or infiltrate is noted. Electronically Signed   By: JJeneen Rinks  Murlean Caller M.D.   On: 11/26/2021 12:01   DG Chest Portable 1 View  Result Date: 11/26/2021 CLINICAL DATA:  Shortness of breath. EXAM: PORTABLE CHEST 1 VIEW COMPARISON:  10/30/2021.  Chest CTA 10/30/2021. FINDINGS: New focal airspace disease in the right lung base consistent with pneumonia. Left lung clear. The cardiopericardial silhouette is within normal limits for size. The visualized bony structures of the thorax are unremarkable. Telemetry leads overlie the chest.  IMPRESSION: New focal airspace disease in the right lung base consistent with pneumonia. Patient had airspace disease in the anterior right upper lobe previously in this may represent progression of that previous finding or new consolidative opacity. Follow-up imaging recommended to ensure resolution. Electronically Signed   By: Misty Stanley M.D.   On: 11/26/2021 08:47   CT Angio Chest PE W and/or Wo Contrast  Result Date: 10/30/2021 CLINICAL DATA:  Pulmonary embolism (PE) suspected, high prob chest pain, also pneumonia vs mass on cxr EXAM: CT ANGIOGRAPHY CHEST WITH CONTRAST TECHNIQUE: Multidetector CT imaging of the chest was performed using the standard protocol during bolus administration of intravenous contrast. Multiplanar CT image reconstructions and MIPs were obtained to evaluate the vascular anatomy. RADIATION DOSE REDUCTION: This exam was performed according to the departmental dose-optimization program which includes automated exposure control, adjustment of the mA and/or kV according to patient size and/or use of iterative reconstruction technique. CONTRAST:  59m OMNIPAQUE IOHEXOL 350 MG/ML SOLN COMPARISON:  Same day chest x-ray.  Chest CT 03/02/2021 FINDINGS: Cardiovascular: Satisfactory opacification of the pulmonary arteries to the segmental level. No evidence of pulmonary embolism. Thoracic aorta is normal in course and caliber. Minimal atherosclerotic calcification of the aortic arch. Normal heart size. No pericardial effusion. Mediastinum/Nodes: Enlarged right hilar node measuring 13 mm short axis (series 4, image 69). Enlarged pretracheal node measuring 11 mm (series 4, image 46). No axillary or left hilar lymphadenopathy. Thyroid, trachea, and esophagus demonstrate no significant findings. Lungs/Pleura: Rounded masslike consolidation within the anterior segment of the right upper lobe abutting the minor fissure measuring approximately 3.9 x 2.4 x 3.9 cm. Surrounding ground-glass opacity with  tree-in-bud nodularity throughout the right upper lobe. Partial atelectasis/collapse involving the inferior aspect of the right middle lobe. The left lung is clear. No pleural effusion or pneumothorax. Upper Abdomen: Diffuse hepatic steatosis. No acute findings within the included upper abdomen. Musculoskeletal: No chest wall abnormality. No acute or significant osseous findings. Review of the MIP images confirms the above findings. IMPRESSION: 1. No evidence of pulmonary embolism. 2. Rounded mass-like consolidation within the anterior segment of the right upper lobe abutting the minor fissure measuring approximately 3.9 x 2.4 x 3.9 cm. Surrounding ground-glass opacity with tree-in-bud nodularity throughout the right upper lobe. Appearance favors pneumonia with infectious/inflammatory bronchiolitis. Follow-up chest CT in 3 months after appropriate treatment is recommended to exclude the possibility of a neoplasm. 3. Enlarged right hilar and mediastinal lymph nodes, likely reactive. 4. Partial atelectasis/collapse involving the inferior aspect of the right middle lobe. 5. Diffuse hepatic steatosis. Electronically Signed   By: NDavina PokeD.O.   On: 10/30/2021 09:40   DG Chest 2 View  Result Date: 10/30/2021 CLINICAL DATA:  Chest pain. EXAM: CHEST - 2 VIEW COMPARISON:  Aug 18, 2021.  March 02, 2021. FINDINGS: The heart size and mediastinal contours are within normal limits. Left lung is clear. There is noted ill-defined opacity seen anteriorly in the right midlung; this may represent infiltrate, although mass cannot be excluded. The visualized skeletal structures are unremarkable. IMPRESSION:  Ill-defined opacity noted anteriorly in the right midlung which may represent pneumonia, but mass cannot be excluded. CT scan is recommended for further evaluation. Electronically Signed   By: Marijo Conception M.D.   On: 10/30/2021 08:50    Microbiology: Results for orders placed or performed during the hospital  encounter of 11/26/21  Culture, blood (routine x 2)     Status: None (Preliminary result)   Collection Time: 11/26/21  7:45 AM   Specimen: BLOOD  Result Value Ref Range Status   Specimen Description BLOOD LEFT AC  Final   Special Requests   Final    BOTTLES DRAWN AEROBIC AND ANAEROBIC Blood Culture results may not be optimal due to an excessive volume of blood received in culture bottles   Culture   Final    NO GROWTH 1 DAY Performed at [facility], [address], [place], [place] [address]    Report Status PENDING  Incomplete  Culture, blood (routine x 2)     Status: None (Preliminary result)   Collection Time: 11/26/21  7:45 AM   Specimen: BLOOD  Result Value Ref Range Status   Specimen Description BLOOD RIGHT FOREARM  Final   Special Requests   Final    BOTTLES DRAWN AEROBIC AND ANAEROBIC Blood Culture adequate volume   Culture   Final    NO GROWTH 1 DAY Performed at [facility], [address] [place], [place] [address]    Report Status PENDING  Incomplete  SARS Coronavirus 2 by RT PCR (hospital order, performed in [place] hospital lab) *cepheid single result test* Anterior Nasal Swab     Status: None   Collection Time: 11/26/21  8:14 AM   Specimen: Anterior Nasal Swab  Result Value Ref Range Status   SARS Coronavirus 2 by RT PCR NEGATIVE NEGATIVE Final    Comment: (NOTE) SARS-CoV-2 target nucleic acids are NOT DETECTED.  The SARS-CoV-2 RNA is generally detectable in upper and lower respiratory specimens during the acute phase of infection. The lowest concentration of SARS-CoV-2 viral copies this assay can detect is 250 copies / mL. A negative result does not preclude SARS-CoV-2 infection and should not be used as the sole basis for treatment or other patient management decisions.  A negative result may occur with improper specimen collection / handling, submission of specimen other than nasopharyngeal swab, presence of viral mutation(s) within  the areas targeted by this assay, and inadequate number of viral copies (<250 copies / mL). A negative result must be combined with clinical observations, patient history, and epidemiological information.  Fact Sheet for Patients:   https://www.patel.info/  Fact Sheet for Healthcare Providers: https://hall.com/  This test is not yet approved or  cleared by the [place] FDA and has been authorized for detection and/or diagnosis of SARS-CoV-2 by FDA under an Emergency Use Authorization (EUA).  This EUA will remain in effect (meaning this test can be used) for the duration of the COVID-19 declaration under Section 564(b)(1) of the Act, 21 U.S.C. section 360bbb-3(b)(1), unless the authorization is terminated or revoked sooner.  Performed at [facility], [address] [place], [place] [address]     Labs: CBC: Recent Labs  Lab 11/26/21 0746  WBC 14.0*  NEUTROABS 11.8*  HGB 15.7*  HCT 46.8*  MCV 106.4*  PLT 009   Basic Metabolic Panel: Recent Labs  Lab 11/26/21 0746  NA 140  K 3.6  CL 106  CO2 24  GLUCOSE 119*  BUN <5*  CREATININE 0.34*  CALCIUM 9.4   Liver Function Tests: Recent Labs  Lab 11/26/21 0746  AST 33  ALT 19  ALKPHOS 72  BILITOT 0.8  PROT 8.8*  ALBUMIN 4.1   CBG: No results for input(s): "GLUCAP" in the last 168 hours.  Discharge time spent: greater than 30 minutes.  Signed: Annita Brod, MD [facility] 11/27/2021

## 2021-11-27 NOTE — Assessment & Plan Note (Signed)
Monitor for withdrawals.  Patient left on following day

## 2021-11-27 NOTE — Assessment & Plan Note (Signed)
Nicotine patch during hospitalization, and patient counseled.

## 2021-11-27 NOTE — Hospital Course (Addendum)
36 year old female with past medical history of hypertension, tobacco abuse, alcohol abuse drinking 8-12 beers a day and COPD who presented to the emergency room on 9/4 with complaints of shortness of breath right lower chest wall pleuritic pain.  Patient states that she has been having issues for the past year and a half since she had COVID and was treated for pneumonia 1 month prior.  Work-up revealed sepsis secondary to a right lower lobe pneumonia seen on chest x-ray and CT.  Patient started on IV fluids and antibiotics.  By hospital day 2, patient breathing a little more comfortably, able to be weaned down to room air.  She stated since she was feeling better, that she was going to leave no matter what.  Had extensive discussion with patient and was able to get medications for her to take home with her for medication management as well as referral to free clinic.  Patient states she will take these medications, however she still chose to leave AMA.

## 2021-11-27 NOTE — Assessment & Plan Note (Signed)
BMI of 18.89, likely consistent with poor nutritional intake due to alcohol use.  Consulted nutrition, but patient left before being able to be seen

## 2021-11-27 NOTE — Progress Notes (Signed)
Initial Nutrition Assessment  DOCUMENTATION CODES:   Not applicable  INTERVENTION:   -Ensure Enlive po TID, each supplement provides 350 kcal and 20 grams of protein -MVI with minerals daily  NUTRITION DIAGNOSIS:   Increased nutrient needs related to chronic illness (COPD) as evidenced by estimated needs.  GOAL:   Patient will meet greater than or equal to 90% of their needs  MONITOR:   PO intake, Supplement acceptance  REASON FOR ASSESSMENT:   Consult Assessment of nutrition requirement/status  ASSESSMENT:   Pt with medical history significant of osteoarthritis, COPD, COVID-pneumonia, history of community-acquired pneumonia x2, hypertension, unspecified thyroid disease, tobacco abuse (2 PPD), alcohol abuse (8-12 beers daily) whopresents progressively worsening dyspnea associated with right lower chest wall pleuritic pain.  Pt admitted with COPD exacerbation and ETOH abuse.   Reviewed I/O's: +1.8 L x 24 hours  Pt unavailable at time of visit. Attempted to speak with pt via call to hospital room phone, however, unable to reach. RD unable to obtain further nutrition-related history or complete nutrition-focused physical exam at this time.    Pt currently on a regular diet; noted meal completions 75%.   Reviewed wt hx; wt has been stable over the past 9 months.   Given pt's ETOH abuse, suspect diet of poor nutritional quality PTA. Pt is at high risk for malnutrition, however, unable to identify at this time. Pt would greatly benefit from addition of oral nutrition supplements.   Medications reviewed and include folic acid, ativan, thiamine, and 0.9% sodium chloride infusion @ 100 ml/hr.    Labs reviewed.   Diet Order:   Diet Order             Diet regular Room service appropriate? Yes; Fluid consistency: Thin  Diet effective now                   EDUCATION NEEDS:   No education needs have been identified at this time  Skin:  Skin Assessment: Reviewed RN  Assessment  Last BM:  11/25/21  Height:   Ht Readings from Last 1 Encounters:  11/26/21 5\' 6"  (1.676 m)    Weight:   Wt Readings from Last 1 Encounters:  11/26/21 53.1 kg    Ideal Body Weight:  59.1 kg  BMI:  Body mass index is 18.89 kg/m.  Estimated Nutritional Needs:   Kcal:  1900-2100  Protein:  105-120 grams  Fluid:  > 1.9 L    01/26/22, RD, LDN, CDCES Registered Dietitian II Certified Diabetes Care and Education Specialist Please refer to [facility] for RD and/or RD on-call/weekend/after hours pager

## 2021-11-27 NOTE — Assessment & Plan Note (Signed)
Secondary to heavy alcohol use.  Discharged on multivitamin plus folate and thiamine.

## 2021-11-27 NOTE — Assessment & Plan Note (Signed)
Improved from admission although patient left before further treatment could be given.  Was discharged on steroid taper plus rescue inhaler plus Trelegy.  Referred to free clinic.

## 2021-11-27 NOTE — TOC Progression Note (Signed)
Transition of Care [facility]) - Progression Note    Patient Details  Name: Kelly Patterson MRN: [id number] Date of Birth: 1985-10-04  [facility]) CM/SW Contact  Marlowe Sax, RN Phone Number: 11/27/2021, 11:07 AM  Clinical Narrative:    Spoke with the patient in the room, [facility]ided [facility] with [facility] and provided instruction on how to get set up , she will get meds at [facility]   Her mother will provide transportation       Expected Discharge Plan and Services           Expected Discharge Date: 11/27/21                                     Social Determinants of Health (SDOH) Interventions    Readmission Risk Interventions     No data to display

## 2021-11-27 NOTE — Plan of Care (Signed)
  Problem: Activity: Goal: Risk for activity intolerance will decrease Outcome: Progressing   Problem: Nutrition: Goal: Adequate nutrition will be maintained Outcome: Progressing   Problem: Elimination: Goal: Will not experience complications related to bowel motility Outcome: Progressing Goal: Will not experience complications related to urinary retention Outcome: Progressing   Problem: Coping: Goal: Level of anxiety will decrease Outcome: Progressing   Problem: Pain Managment: Goal: General experience of comfort will improve Outcome: Progressing

## 2021-12-01 LAB — CULTURE, BLOOD (ROUTINE X 2)
Culture: NO GROWTH
Culture: NO GROWTH
Special Requests: ADEQUATE

## 2021-12-21 ENCOUNTER — Other Ambulatory Visit: Payer: Self-pay

## 2022-07-11 ENCOUNTER — Emergency Department
Admission: EM | Admit: 2022-07-11 | Discharge: 2022-07-11 | Disposition: A | Discharge status: Home / Self Care | Payer: Self-pay | Attending: Emergency Medicine | Admitting: Emergency Medicine

## 2022-07-11 ENCOUNTER — Emergency Department: Payer: Self-pay

## 2022-07-11 ENCOUNTER — Other Ambulatory Visit: Payer: Self-pay

## 2022-07-11 DIAGNOSIS — F17210 Nicotine dependence, cigarettes, uncomplicated: Secondary | ICD-10-CM | POA: Insufficient documentation

## 2022-07-11 DIAGNOSIS — J441 Chronic obstructive pulmonary disease with (acute) exacerbation: Secondary | ICD-10-CM | POA: Insufficient documentation

## 2022-07-11 DIAGNOSIS — R000 Tachycardia, unspecified: Secondary | ICD-10-CM | POA: Insufficient documentation

## 2022-07-11 LAB — BASIC METABOLIC PANEL
Anion gap: 16 — ABNORMAL HIGH (ref 5–15)
BUN: <5 mg/dL — ABNORMAL LOW (ref 6–20)
CO2: 18 mmol/L — ABNORMAL LOW (ref 22–32)
Calcium: 9 mg/dL (ref 8.9–10.3)
Chloride: 100 mmol/L (ref 98–111)
Creatinine, Ser: 0.42 mg/dL — ABNORMAL LOW (ref 0.44–1.00)
GFR, Estimated: >60 mL/min (ref >60)
Glucose, Bld: 89 mg/dL (ref 70–99)
Potassium: 3.9 mmol/L (ref 3.5–5.1)
Sodium: 134 mmol/L — ABNORMAL LOW (ref 135–145)

## 2022-07-11 LAB — TROPONIN I (HIGH SENSITIVITY): Troponin I (High Sensitivity): <2 ng/L (ref <18)

## 2022-07-11 MED ORDER — PREDNISONE 50 MG PO TABS: 50.0000 mg | ORAL_TABLET | Freq: Every day | ORAL | 0 refills | Status: DC

## 2022-07-11 MED ORDER — IPRATROPIUM-ALBUTEROL 0.5-2.5 (3) MG/3ML IN SOLN
3.0000 mL | Freq: Once | RESPIRATORY_TRACT | Status: AC
  Administered 2022-07-11: 3 mL via RESPIRATORY_TRACT
  Filled 2022-07-11: qty 3

## 2022-07-11 MED ORDER — ALBUTEROL SULFATE HFA 108 (90 BASE) MCG/ACT IN AERS: 2.0000 | INHALATION_SPRAY | Freq: Four times a day (QID) | RESPIRATORY_TRACT | 2 refills | Status: DC | PRN

## 2022-07-11 MED ORDER — METHYLPREDNISOLONE SODIUM SUCC 125 MG IJ SOLR
125.0000 mg | Freq: Once | INTRAMUSCULAR | Status: AC
  Administered 2022-07-11: 125 mg via INTRAVENOUS
  Filled 2022-07-11: qty 2

## 2022-07-11 NOTE — ED Notes (Signed)
Pt presents to ED with c/o of increasing SOB over the past few weeks. Pt does endorse smoking 2 packs of cigarettes a day, pt educated on the need to cut back. Pt states no PCP and has not been able to get inhalers, pt states insurance is an issue.  Pt appears well at this time, NAD noted. Pt states she feels better after breathing TX.   Pt denies fevers or chills. Pt speaking in complete sentences with no issue. Pt states chronic cough and no changes in the cough at this time.

## 2022-07-11 NOTE — ED Triage Notes (Signed)
Pt to ED POV for shob started worsening recently. Pt has COPD, states gets exacerbations. Wheezing noted. Pt reports having trouble speaking.  States has no PCP, has not been able to get any inhalers

## 2022-07-11 NOTE — ED Notes (Signed)
1 unsuccessful IV attempt.

## 2022-07-11 NOTE — ED Provider Notes (Signed)
[facility] Provider Note    Event Date/Time   First MD Initiated Contact with Patient 07/11/22 1158     (approximate)   History   Shortness of Breath   HPI  Kelly Patterson is a 37 y.o. female with a history of COPD who presents with complaints of shortness of breath.  She continues to smoke cigarettes.  She reports symptoms worsened today.  No fevers reported.     Physical Exam   Triage Vital Signs: ED Triage Vitals  Enc Vitals Group     BP 07/11/22 1138 109/75     Pulse Rate 07/11/22 1138 95     Resp 07/11/22 1138 (!) 26     Temp 07/11/22 1138 98.4 F (36.9 C)     Temp src --      SpO2 07/11/22 1138 100 %     Weight 07/11/22 1139 53.1 kg (117 lb)     Height 07/11/22 1139 1.676 m ( )     Head Circumference --      Peak Flow --      Pain Score 07/11/22 1138 7     Pain Loc --      Pain Edu? --      Excl. in GC? --     Most recent vital signs: Vitals:   07/11/22 1138  BP: 109/75  Pulse: 95  Resp: (!) 26  Temp: 98.4 F (36.9 C)  SpO2: 100%     General: Awake, no distress.  CV:  Good peripheral perfusion.  Resp:    Scattered wheezes, mild tachypnea Abd:  No distention.  Other:  No calf pain or swelling   ED Results / Procedures / Treatments   Labs (all labs ordered are listed, but only abnormal results are displayed) Labs Reviewed  BASIC METABOLIC PANEL - Abnormal; Notable for the following components:      Result Value   Sodium 134 (*)    CO2 18 (*)    BUN <5 (*)    Creatinine, Ser 0.42 (*)    Anion gap 16 (*)    All other components within normal limits  CBC  POC URINE PREG, ED  TROPONIN I (HIGH SENSITIVITY)  TROPONIN I (HIGH SENSITIVITY)     EKG  ED ECG REPORT I, Jene Every, the attending physician, personally viewed and interpreted this ECG.  Date: 07/11/2022  Rhythm: Sinus tachycardia QRS Axis: normal Intervals: normal ST/T Wave abnormalities: normal Narrative Interpretation: no evidence of  acute ischemia    RADIOLOGY Chest x-ray viewed interpreted by me, hyperventilation, no pneumonia    PROCEDURES:  Critical Care performed:   Procedures   MEDICATIONS ORDERED IN ED: Medications  ipratropium-albuterol (DUONEB) 0.5-2.5 (3) MG/3ML nebulizer solution 3 mL (3 mLs Nebulization Given 07/11/22 1147)  ipratropium-albuterol (DUONEB) 0.5-2.5 (3) MG/3ML nebulizer solution 3 mL (3 mLs Nebulization Given 07/11/22 1300)  methylPREDNISolone sodium succinate (SOLU-MEDROL) 125 mg/2 mL injection 125 mg (125 mg Intravenous Given 07/11/22 1300)     IMPRESSION / MDM / ASSESSMENT AND PLAN / ED COURSE  I reviewed the triage vital signs and the nursing notes. Patient's presentation is most consistent with exacerbation of chronic illness.  Patient presents with shortness of breath as detailed above she presented with increased work of breathing and tachypnea with wheezing.  But improvement after nebulizer.  Lab work thus far is reassuring, chest x-ray reviewed by me, no evidence of pneumonia  3 minutes of smoking cessation counseling to the patient given advanced chest  x-ray changes at such an early age  IV Solu-Medrol given, additional DuoNeb ordered.  Will prescribe inhaler for the patient as she does not have 1.        FINAL CLINICAL IMPRESSION(S) / ED DIAGNOSES   Final diagnoses:  COPD exacerbation     Rx / DC Orders   ED Discharge Orders          Ordered    Ambulatory referral to Pulmonology        07/11/22 1302    albuterol (VENTOLIN HFA) 108 (90 Base) MCG/ACT inhaler  Every 6 hours PRN        07/11/22 1302    predniSONE (DELTASONE) 50 MG tablet  Daily with breakfast        07/11/22 1302             Note:  This document was prepared using Dragon voice recognition software and may include unintentional dictation errors.   Jene Every, MD 07/11/22 1328

## 2023-12-12 IMAGING — CR DG CHEST 2V
2 series · 2 of 2 positions shown · non-contrast
Comparison: Chest radiograph 03/01/2021.

CLINICAL DATA: Shortness of breath.

EXAM:
CHEST - 2 VIEW

[chest pa]
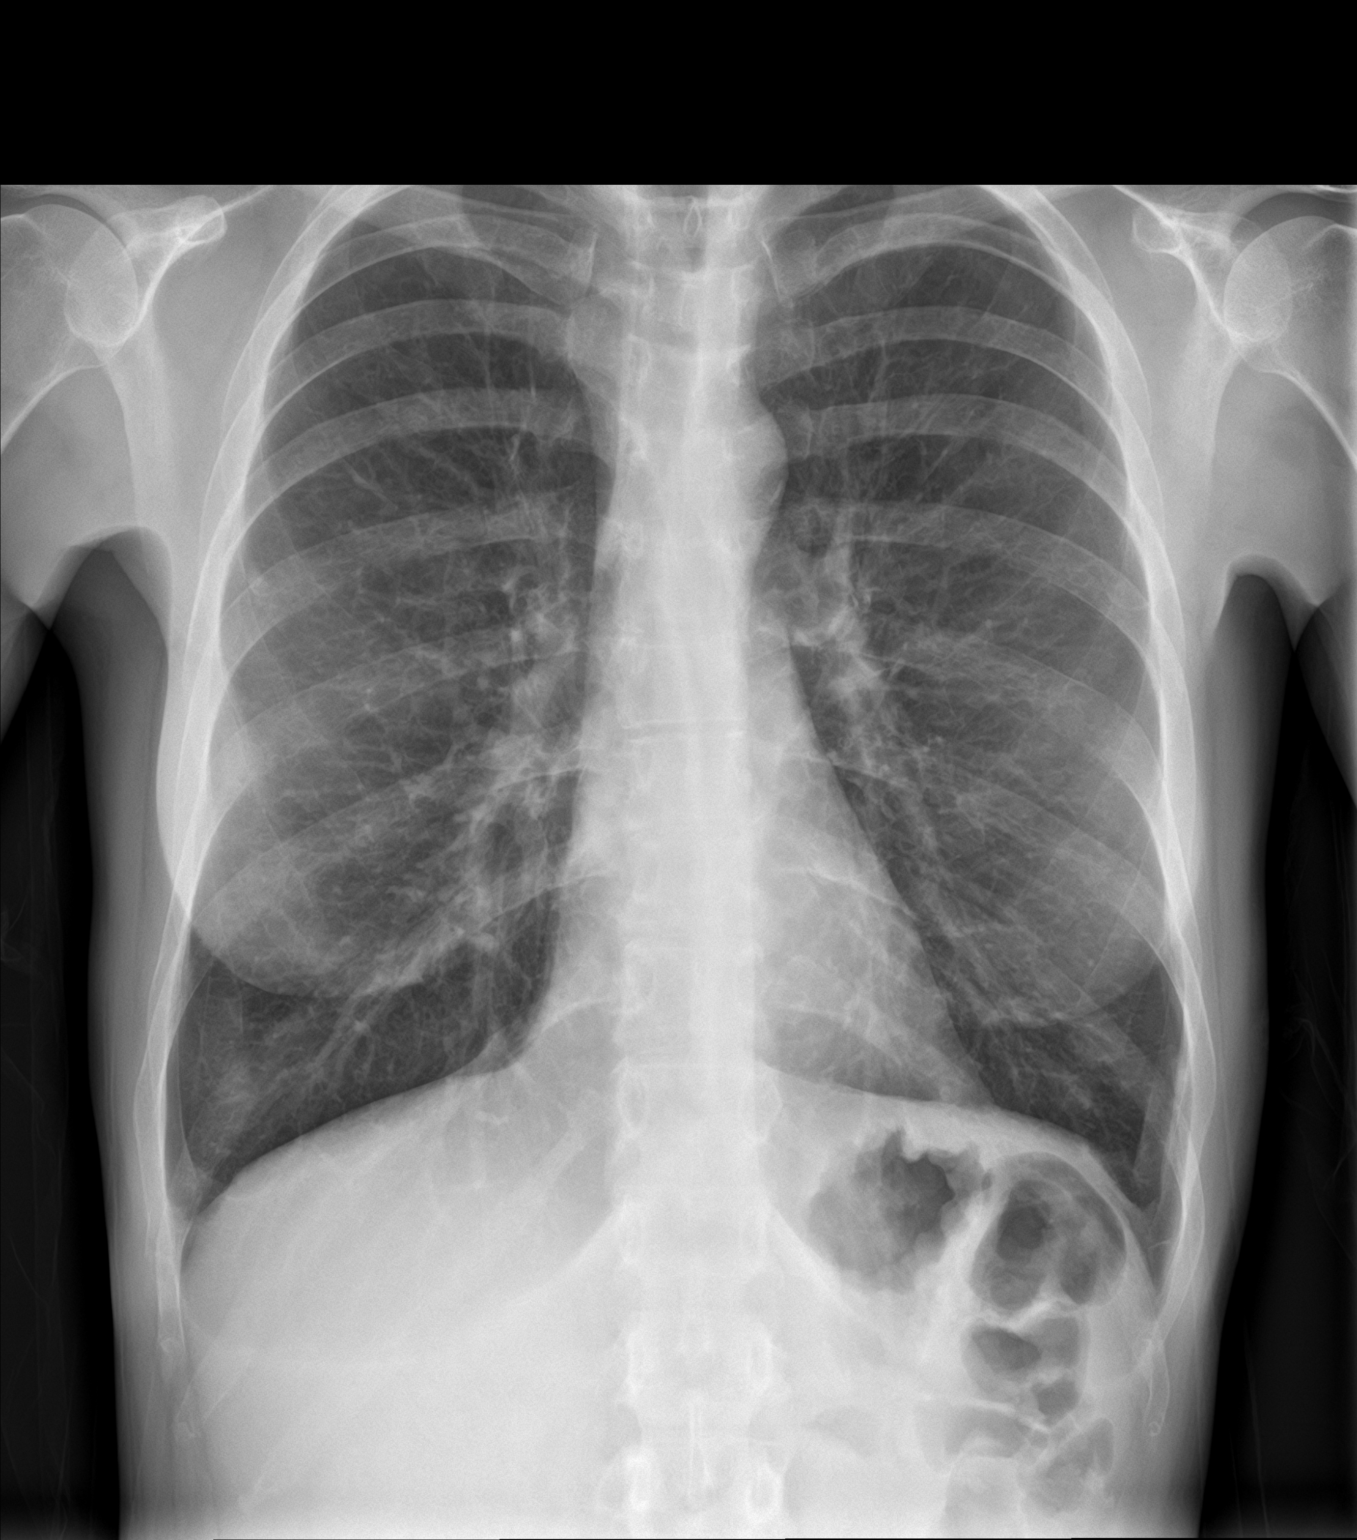

[chest lat]
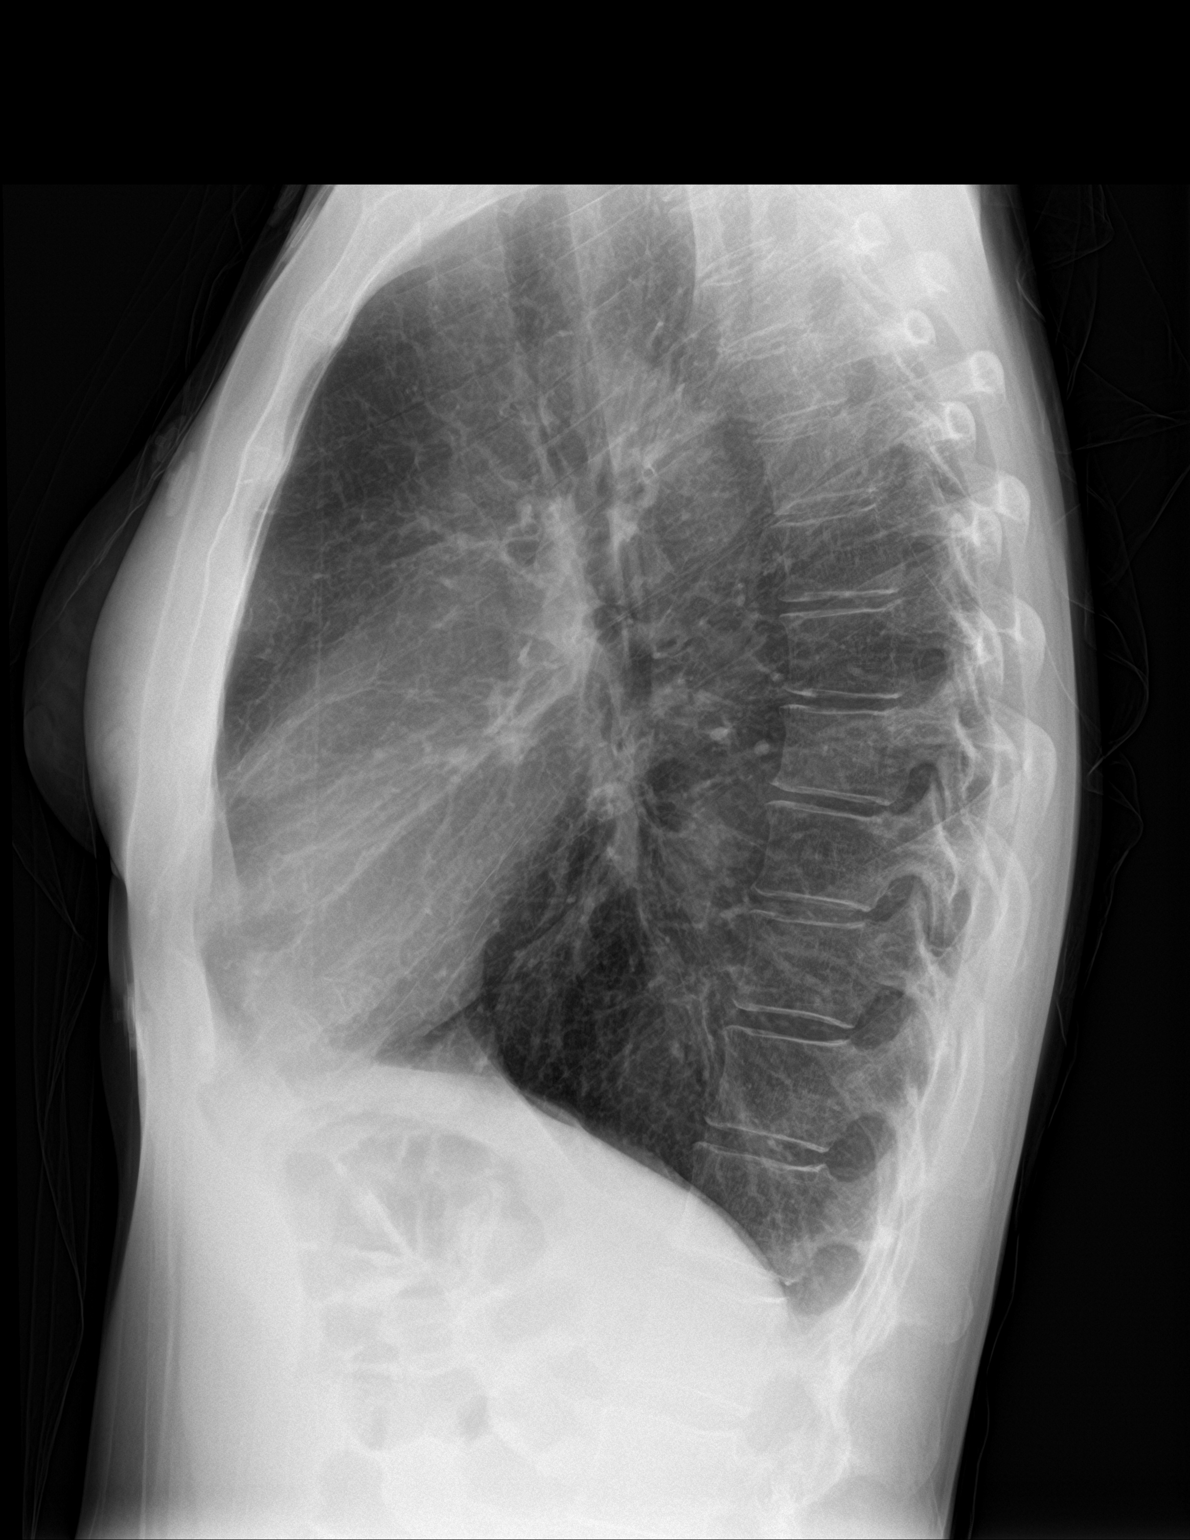

[2 of 2 positions shown; findings below may reference images not displayed]

FINDINGS: No consolidation. No visible pleural effusion or pneumothorax.
Cardiomediastinal silhouette is within normal limits. No evidence of
acute osseous abnormality.
IMPRESSION: No evidence of acute cardiopulmonary disease.

## 2023-12-14 ENCOUNTER — Other Ambulatory Visit: Payer: Self-pay

## 2023-12-14 ENCOUNTER — Emergency Department

## 2023-12-14 ENCOUNTER — Emergency Department
Admission: EM | Admit: 2023-12-14 | Discharge: 2023-12-14 | Discharge status: Against Medical Advice | Attending: Emergency Medicine | Admitting: Emergency Medicine

## 2023-12-14 DIAGNOSIS — R0602 Shortness of breath: Secondary | ICD-10-CM | POA: Insufficient documentation

## 2023-12-14 DIAGNOSIS — Z5321 Procedure and treatment not carried out due to patient leaving prior to being seen by health care provider: Secondary | ICD-10-CM | POA: Insufficient documentation

## 2023-12-14 DIAGNOSIS — R058 Other specified cough: Secondary | ICD-10-CM | POA: Insufficient documentation

## 2023-12-14 DIAGNOSIS — R0981 Nasal congestion: Secondary | ICD-10-CM | POA: Insufficient documentation

## 2023-12-14 LAB — RESP PANEL BY RT-PCR (RSV, FLU A&B, COVID)  RVPGX2
Influenza A by PCR: NEGATIVE
Influenza B by PCR: NEGATIVE
Resp Syncytial Virus by PCR: NEGATIVE
SARS Coronavirus 2 by RT PCR: NEGATIVE

## 2023-12-14 NOTE — ED Notes (Signed)
 Pt to desk stating she is ready to leave, requested from security to get her knife back. Pt visualized ambulatory with steady gait out to the lobby. NAD noted at this time.

## 2023-12-14 NOTE — ED Triage Notes (Addendum)
 Pt reports productive cough congestion and feels short of breath for the past 2 weeks. Pt also reports she accidentally put 2 tampons in yesterday, pt removed both but now has pain.

## 2023-12-23 ENCOUNTER — Ambulatory Visit

## 2024-02-10 ENCOUNTER — Emergency Department
Admission: EM | Admit: 2024-02-10 | Discharge: 2024-02-10 | Disposition: A | Discharge status: Home / Self Care | Attending: Emergency Medicine | Admitting: Emergency Medicine

## 2024-02-10 ENCOUNTER — Other Ambulatory Visit: Payer: Self-pay

## 2024-02-10 DIAGNOSIS — L03114 Cellulitis of left upper limb: Secondary | ICD-10-CM | POA: Insufficient documentation

## 2024-02-10 DIAGNOSIS — W57XXXA Bitten or stung by nonvenomous insect and other nonvenomous arthropods, initial encounter: Secondary | ICD-10-CM | POA: Insufficient documentation

## 2024-02-10 DIAGNOSIS — S50862A Insect bite (nonvenomous) of left forearm, initial encounter: Secondary | ICD-10-CM | POA: Insufficient documentation

## 2024-02-10 DIAGNOSIS — J449 Chronic obstructive pulmonary disease, unspecified: Secondary | ICD-10-CM | POA: Insufficient documentation

## 2024-02-10 DIAGNOSIS — I10 Essential (primary) hypertension: Secondary | ICD-10-CM | POA: Insufficient documentation

## 2024-02-10 DIAGNOSIS — S40862A Insect bite (nonvenomous) of left upper arm, initial encounter: Secondary | ICD-10-CM | POA: Diagnosis not present

## 2024-02-10 MED ORDER — SULFAMETHOXAZOLE-TRIMETHOPRIM 200-40 MG/5ML PO SUSP: 20.0000 mL | Freq: Two times a day (BID) | ORAL | 0 refills | Status: AC

## 2024-02-10 MED ORDER — SULFAMETHOXAZOLE-TRIMETHOPRIM 200-40 MG/5ML PO SUSP
20.0000 mL | Freq: Once | ORAL | Status: AC
  Administered 2024-02-10: 20 mL via ORAL
  Filled 2024-02-10: qty 20

## 2024-02-10 MED ORDER — HYDROCORTISONE 1 % EX CREA
TOPICAL_CREAM | Freq: Once | CUTANEOUS | Status: AC
  Administered 2024-02-10: 1 via TOPICAL
  Filled 2024-02-10: qty 28

## 2024-02-10 NOTE — ED Triage Notes (Signed)
 Pt was bitten by a spider 2 nights ago on their left forearm and when they woke up yesterday morning it was swollen and itchy, pt scratched at the bite and pt thinks that it might be infected. Pt is A&Ox4 during triage.

## 2024-02-10 NOTE — Discharge Instructions (Addendum)
 You have been seen today in the Emergency Department (ED) for cellulitis, a superficial skin infection. Please take your antibiotics as prescribed for their ENTIRE prescribed duration.  Take Tylenol  or Motrin as needed for pain, but only as written on the box.   Please follow up with your doctor or in the ED in 24-48 hours for recheck of your infection if you are not improving.  Call your doctor sooner or return to the ED if you develop worsening signs of infection such as: increased redness, increased pain, pus, fever, or other symptoms that concern you.   I have placed a referral to primary care for you to establish care.

## 2024-02-10 NOTE — ED Provider Notes (Signed)
 [facility] Provider Note    Event Date/Time   First MD Initiated Contact with Patient 02/10/24 1911     (approximate)   History   Insect Bite   HPI  Kelly Patterson is a 38 y.o. female  with a past medical history of COPD, hypertension, thyroid disease presents to the emergency department with insect bite that occurred 2 nights ago on her left anterior forearm.  Patient believes it was a brown recluse spider and saw the spider crawling on her.  Patient reports it is itchy, erythematous, and swollen and she has been scratching at it.  Denies any fever or chills or pus-like discharge or injury.  No allergies.  Patient used alcohol on it at home to clean it.  Physical Exam   Triage Vital Signs: ED Triage Vitals  Encounter Vitals Group     BP 02/10/24 1835 120/77     Girls Systolic BP Percentile --      Girls Diastolic BP Percentile --      Boys Systolic BP Percentile --      Boys Diastolic BP Percentile --      Pulse Rate 02/10/24 1835 91     Resp 02/10/24 1835 17     Temp 02/10/24 1835 98 F (36.7 C)     Temp Source 02/10/24 1835 Oral     SpO2 02/10/24 1835 97 %     Weight 02/10/24 1836 110 lb (49.9 kg)     Height 02/10/24 1836 5' 6 (1.676 m)     Head Circumference --      Peak Flow --      Pain Score 02/10/24 1836 10     Pain Loc --      Pain Education --      Exclude from Growth Chart --     Most recent vital signs: Vitals:   02/10/24 1835  BP: 120/77  Pulse: 91  Resp: 17  Temp: 98 F (36.7 C)  SpO2: 97%    General: Awake, in no acute distress. Appears stated age. CV: Good peripheral perfusion. Radial pulses 2+ b/l. Cap refill <2 sec. Respiratory:Normal respiratory effort.  No respiratory distress.  GI: Soft, non-distended. MSK: Normal ROM and  5/5 strength in b/l wrists and fingers.  Skin:Warm, dry, scabbed over bite with surrounding erythema on left anterior forearm, ulnar side. No pus-like drainage. Neurological: A&Ox4 to  person, place, time, and situation.   ED Results / Procedures / Treatments   Labs (all labs ordered are listed, but only abnormal results are displayed) Labs Reviewed - No data to display   EKG     RADIOLOGY    PROCEDURES:  Critical Care performed: No   Procedures   MEDICATIONS ORDERED IN ED: Medications  hydrocortisone  cream 1 % (1 Application Topical Given 02/10/24 1946)  sulfamethoxazole -trimethoprim  (BACTRIM ) 200-40 MG/5ML suspension 20 mL (20 mLs Oral Given 02/10/24 2010)     IMPRESSION / MDM / ASSESSMENT AND PLAN / ED COURSE  I reviewed the triage vital signs and the nursing notes.                              Differential diagnosis includes, but is not limited to, insect bite, cellulitis, brown recluse spider bite, laceration  Patient's presentation is most consistent with acute, uncomplicated illness.  Patient here with insect bite to left anterior forearm on the ulnar side as described above.  She is afebrile  and well-appearing.  Neurovascularly intact.  Reports it is mainly itchy and it does have some surrounding erythema on appearance.  She showed me a picture from yesterday on her phone which shows some more significant swelling compared to minimal swelling now.  I will cover her for cellulitis as a precaution.  She was given hydrocortisone  cream and requested liquid medication since she states she has trouble taking pills.  Given 1 dose of Bactrim  here and had liquid Bactrim  prescription sent to her pharmacy of choice.  Wound care instructions discussed with her as well as return precautions.  Also placed referral for primary care to establish care.  The patient may return to the emergency department for any new, worsening, or concerning symptoms. Patient was given the opportunity to ask questions; all questions were answered. Emergency department return precautions were discussed with the patient.  Patient is in agreement to the treatment plan.  Patient is  stable for discharge.    FINAL CLINICAL IMPRESSION(S) / ED DIAGNOSES   Final diagnoses:  Insect bite of left forearm, initial encounter  Cellulitis of left forearm     Rx / DC Orders   ED Discharge Orders          Ordered    Ambulatory Referral to Primary Care (Establish Care)        02/10/24 2026    sulfamethoxazole -trimethoprim  (BACTRIM ) 200-40 MG/5ML suspension  2 times daily        02/10/24 2028    sulfamethoxazole -trimethoprim  (BACTRIM ) 200-40 MG/5ML suspension  2 times daily        Pending             Note:  This document was prepared using Dragon voice recognition software and may include unintentional dictation errors.     Sheron Salm, PA-C 02/10/24 2040    Jossie Artist POUR, MD 02/14/24 [phone]

## 2024-02-24 ENCOUNTER — Encounter: Payer: Self-pay | Admitting: *Deleted

## 2024-02-24 ENCOUNTER — Emergency Department

## 2024-02-24 ENCOUNTER — Other Ambulatory Visit: Payer: Self-pay

## 2024-02-24 ENCOUNTER — Emergency Department
Admission: EM | Admit: 2024-02-24 | Discharge: 2024-02-24 | Disposition: A | Discharge status: Home / Self Care | Attending: Emergency Medicine | Admitting: Emergency Medicine

## 2024-02-24 DIAGNOSIS — I700 Atherosclerosis of aorta: Secondary | ICD-10-CM | POA: Diagnosis not present

## 2024-02-24 DIAGNOSIS — J180 Bronchopneumonia, unspecified organism: Secondary | ICD-10-CM | POA: Insufficient documentation

## 2024-02-24 DIAGNOSIS — F17210 Nicotine dependence, cigarettes, uncomplicated: Secondary | ICD-10-CM | POA: Diagnosis not present

## 2024-02-24 DIAGNOSIS — R0981 Nasal congestion: Secondary | ICD-10-CM | POA: Diagnosis not present

## 2024-02-24 DIAGNOSIS — R062 Wheezing: Secondary | ICD-10-CM | POA: Diagnosis not present

## 2024-02-24 DIAGNOSIS — R059 Cough, unspecified: Secondary | ICD-10-CM | POA: Diagnosis not present

## 2024-02-24 DIAGNOSIS — J449 Chronic obstructive pulmonary disease, unspecified: Secondary | ICD-10-CM | POA: Diagnosis not present

## 2024-02-24 DIAGNOSIS — R918 Other nonspecific abnormal finding of lung field: Secondary | ICD-10-CM | POA: Diagnosis not present

## 2024-02-24 LAB — GROUP A STREP BY PCR: Group A Strep by PCR: NOT DETECTED

## 2024-02-24 LAB — RESP PANEL BY RT-PCR (RSV, FLU A&B, COVID)  RVPGX2
Influenza A by PCR: NEGATIVE
Influenza B by PCR: NEGATIVE
Resp Syncytial Virus by PCR: NEGATIVE
SARS Coronavirus 2 by RT PCR: NEGATIVE

## 2024-02-24 MED ORDER — AZITHROMYCIN 200 MG/5ML PO SUSR
500.0000 mg | Freq: Once | ORAL | Status: AC
  Administered 2024-02-24: 500 mg via ORAL
  Filled 2024-02-24: qty 12.5

## 2024-02-24 MED ORDER — BUDESONIDE-FORMOTEROL FUMARATE 80-4.5 MCG/ACT IN AERO: 2.0000 | INHALATION_SPRAY | Freq: Three times a day (TID) | RESPIRATORY_TRACT | 12 refills | Status: AC | PRN

## 2024-02-24 MED ORDER — PREDNISOLONE SODIUM PHOSPHATE 15 MG/5ML PO SOLN: 50.0000 mg | Freq: Every day | ORAL | 0 refills | Status: AC

## 2024-02-24 MED ORDER — PREDNISONE 20 MG PO TABS
60.0000 mg | ORAL_TABLET | Freq: Once | ORAL | Status: DC
  Filled 2024-02-24: qty 3

## 2024-02-24 MED ORDER — IPRATROPIUM-ALBUTEROL 0.5-2.5 (3) MG/3ML IN SOLN
3.0000 mL | Freq: Once | RESPIRATORY_TRACT | Status: AC
  Administered 2024-02-24: 3 mL via RESPIRATORY_TRACT
  Filled 2024-02-24: qty 3

## 2024-02-24 MED ORDER — ALBUTEROL SULFATE HFA 108 (90 BASE) MCG/ACT IN AERS: 2.0000 | INHALATION_SPRAY | Freq: Four times a day (QID) | RESPIRATORY_TRACT | 2 refills | Status: AC | PRN

## 2024-02-24 MED ORDER — AZITHROMYCIN 200 MG/5ML PO SUSR: 250.0000 mg | Freq: Every day | ORAL | 0 refills | Status: AC

## 2024-02-24 MED ORDER — PREDNISOLONE SODIUM PHOSPHATE 15 MG/5ML PO SOLN
60.0000 mg | Freq: Once | ORAL | Status: AC
  Administered 2024-02-24: 60 mg via ORAL
  Filled 2024-02-24: qty 20

## 2024-02-24 NOTE — Discharge Instructions (Signed)
 Please call and schedule follow-up appointment with your pulmonologist.  Take the medication as prescribed until finished.  If your symptoms change or worsen and you are unable to schedule appointment with primary care or the pulmonologist, return to the emergency department.

## 2024-02-24 NOTE — ED Notes (Signed)
 Pt was ambulatory to room from lobby - no respiratory distress noted.

## 2024-02-24 NOTE — ED Notes (Signed)
 Pt sts she is unable to tolerate pills. Offered pill with water, pills with graham crackers, offered to cut pills, and offered pills with applesauce. Pt refused all interventions. Sts she is unable to tolerate them despite interventions. Refused po prednisone . Requesting oral alternative. PA informed of pt request.

## 2024-02-24 NOTE — ED Triage Notes (Signed)
 Pt ambulatory to triage.  Pt has body aches, chills and nasal congestion.  Pt has a sore throat also.  Sx for 2-3 days  pt alert.

## 2024-02-24 NOTE — ED Provider Notes (Signed)
 [facility] Provider Note    Event Date/Time   First MD Initiated Contact with Patient 02/24/24 2038     (approximate)   History   Nasal Congestion   HPI  Kelly Patterson is a 38 y.o. female  with history of COPD, cigarette smoker, and as listed in EMR presents to the emergency department for two days of chills, body aches, cough, nasal congestion and sore throat for the past 3 days.  No fever.   Physical Exam    Vitals:   02/24/24 1950 02/24/24 2224  BP: 121/81 110/72  Pulse: 95 (!) 109  Resp: 18 20  Temp: 98.6 F (37 C)   SpO2: 96% 94%    General: Awake, no distress.  CV:  Good peripheral perfusion.  Resp:  Normal effort.  Wheezing and rhonchi diffuse.  Partially clears with cough. Abd:  No distention.  Other:     ED Results / Procedures / Treatments   Labs (all labs ordered are listed, but only abnormal results are displayed)  Labs Reviewed  GROUP A STREP BY PCR  RESP PANEL BY RT-PCR (RSV, FLU A&B, COVID)  [id number]     EKG  Not indicated   RADIOLOGY  Image and radiology report reviewed and interpreted by me. Radiology report consistent with the same.  Chest x-ray shows patchy airspace opacities in the middle right lung base potentially representing atelectasis or developing bronchopneumonia  PROCEDURES:  Critical Care performed: No  Procedures   MEDICATIONS ORDERED IN ED:  Medications  ipratropium-albuterol  (DUONEB) 0.5-2.5 (3) MG/3ML nebulizer solution 3 mL (3 mLs Nebulization Given 02/24/24 2055)  ipratropium-albuterol  (DUONEB) 0.5-2.5 (3) MG/3ML nebulizer solution 3 mL (3 mLs Nebulization Given 02/24/24 2143)  prednisoLONE  (ORAPRED ) 15 MG/5ML solution 60 mg (60 mg Oral Given 02/24/24 2147)  azithromycin  (ZITHROMAX ) 200 MG/5ML suspension 500 mg (500 mg Oral Given 02/24/24 2223)     IMPRESSION / MDM / ASSESSMENT AND PLAN / ED COURSE   I have reviewed the triage note and vital signs. Vital signs are  stable.   Differential diagnosis includes, but is not limited to, COVID, influenza, pneumonia, COPD exacerbation  Patient's presentation is most consistent with acute illness / injury with system symptoms.  38 year old female presenting to the emergency department for treatment and evaluation of cough, body aches, chills, nasal congestion and sore throat for the past 2 to 3 days.  See HPI.  On exam, she has wheezing and rhonchi throughout that does clear somewhat with cough.  She is a cigarette smoker with COPD.  Vital signs are reassuring.  SpO2 is 96% on room air and she is not tachycardic or tachypneic.  She is also afebrile and normotensive.  Plan will be to give her DuoNeb and steroid.  She reports that she is unable to swallow a pill and if she does manage to get a pill down she vomits it back up so all medications need to be liquid.  Prednisone  ordered in place of prednisone .   Chest x-ray shows what appears to be a early bronchopneumonia in the middle right lung base.  Results discussed with the patient.  She was reassessed and continues to have coarse breath sounds.  Albuterol  ordered.  Liquid azithromycin  requested as well.  She will be discharged home with prescriptions for albuterol , prednisolone , azithromycin , and Symbicort.  She is to call and schedule an appointment with her pulmonologist or primary care provider later this week or early next week.  If she develops a fever,  shortness of breath worsens from baseline, or new symptoms she is to return to the emergency department if she is unable to schedule an appointment.  Patient is agreeable to this plan.      FINAL CLINICAL IMPRESSION(S) / ED DIAGNOSES   Final diagnoses:  Bronchopneumonia     Rx / DC Orders   ED Discharge Orders          Ordered    azithromycin  (ZITHROMAX ) 200 MG/5ML suspension  Daily        02/24/24 2229    prednisoLONE  (ORAPRED ) 15 MG/5ML solution  Daily        02/24/24 2229    albuterol   (VENTOLIN  HFA) 108 (90 Base) MCG/ACT inhaler  Every 6 hours PRN        02/24/24 2229    budesonide-formoterol (SYMBICORT) 80-4.5 MCG/ACT inhaler  Every 8 hours PRN        02/24/24 2229             Note:  This document was prepared using Dragon voice recognition software and may include unintentional dictation errors.   Herlinda Kirk NOVAK, FNP 02/24/24 2230    Suzanne Kirsch, MD 02/24/24 2250

## 2024-02-24 NOTE — ED Notes (Signed)
 Resp panel swab recollected and sent to the lab. New order placed

## 2024-02-28 ENCOUNTER — Emergency Department
Admission: EM | Admit: 2024-02-28 | Discharge: 2024-02-28 | Disposition: A | Discharge status: Home / Self Care | Attending: Emergency Medicine | Admitting: Emergency Medicine

## 2024-02-28 ENCOUNTER — Emergency Department

## 2024-02-28 ENCOUNTER — Encounter: Payer: Self-pay | Admitting: Emergency Medicine

## 2024-02-28 ENCOUNTER — Other Ambulatory Visit: Payer: Self-pay

## 2024-02-28 DIAGNOSIS — R918 Other nonspecific abnormal finding of lung field: Secondary | ICD-10-CM | POA: Diagnosis not present

## 2024-02-28 DIAGNOSIS — Y906 Blood alcohol level of 120-199 mg/100 ml: Secondary | ICD-10-CM | POA: Diagnosis not present

## 2024-02-28 DIAGNOSIS — F10239 Alcohol dependence with withdrawal, unspecified: Secondary | ICD-10-CM | POA: Diagnosis not present

## 2024-02-28 DIAGNOSIS — J181 Lobar pneumonia, unspecified organism: Secondary | ICD-10-CM | POA: Insufficient documentation

## 2024-02-28 DIAGNOSIS — F10930 Alcohol use, unspecified with withdrawal, uncomplicated: Secondary | ICD-10-CM

## 2024-02-28 DIAGNOSIS — J189 Pneumonia, unspecified organism: Secondary | ICD-10-CM

## 2024-02-28 DIAGNOSIS — R0602 Shortness of breath: Secondary | ICD-10-CM | POA: Diagnosis not present

## 2024-02-28 LAB — CBC
HCT: 45.7 % (ref 36.0–46.0)
Hemoglobin: 15.8 g/dL — ABNORMAL HIGH (ref 12.0–15.0)
MCH: 36.7 pg — ABNORMAL HIGH (ref 26.0–34.0)
MCHC: 34.6 g/dL (ref 30.0–36.0)
MCV: 106 fL — ABNORMAL HIGH (ref 80.0–100.0)
Platelets: 148 K/uL — ABNORMAL LOW (ref 150–400)
RBC: 4.31 MIL/uL (ref 3.87–5.11)
RDW: 12.9 % (ref 11.5–15.5)
WBC: 10.8 K/uL — ABNORMAL HIGH (ref 4.0–10.5)
nRBC: 0 % (ref 0.0–0.2)

## 2024-02-28 LAB — COMPREHENSIVE METABOLIC PANEL WITH GFR
ALT: 44 U/L (ref 0–44)
AST: 85 U/L — ABNORMAL HIGH (ref 15–41)
Albumin: 4.6 g/dL (ref 3.5–5.0)
Alkaline Phosphatase: 51 U/L (ref 38–126)
Anion gap: 15 (ref 5–15)
BUN: <5 mg/dL — ABNORMAL LOW (ref 6–20)
CO2: 25 mmol/L (ref 22–32)
Calcium: 9 mg/dL (ref 8.9–10.3)
Chloride: 95 mmol/L — ABNORMAL LOW (ref 98–111)
Creatinine, Ser: 0.56 mg/dL (ref 0.44–1.00)
GFR, Estimated: >60 mL/min (ref >60)
Glucose, Bld: 98 mg/dL (ref 70–99)
Potassium: 3.5 mmol/L (ref 3.5–5.1)
Sodium: 134 mmol/L — ABNORMAL LOW (ref 135–145)
Total Bilirubin: 0.8 mg/dL (ref 0.0–1.2)
Total Protein: 7.9 g/dL (ref 6.5–8.1)

## 2024-02-28 LAB — ETHANOL: Alcohol, Ethyl (B): 195 mg/dL — ABNORMAL HIGH (ref <15)

## 2024-02-28 LAB — LIPASE, BLOOD: Lipase: 101 U/L — ABNORMAL HIGH (ref 11–51)

## 2024-02-28 MED ORDER — ONDANSETRON 4 MG PO TBDP
4.0000 mg | ORAL_TABLET | Freq: Once | ORAL | Status: AC | PRN
  Administered 2024-02-28: 4 mg via ORAL

## 2024-02-28 MED ORDER — ONDANSETRON 4 MG PO TBDP: 4.0000 mg | ORAL_TABLET | Freq: Three times a day (TID) | ORAL | 0 refills | Status: AC | PRN

## 2024-02-28 NOTE — ED Notes (Signed)
 Pt provided w/ gingerale, PB& crackers for PO challenge which was successful

## 2024-02-28 NOTE — ED Triage Notes (Signed)
 Patient arrives ambulatory by POV states she was here a few days ago and diagnosed with pneumonia. States no improvement. Having pain to throat, head and ears. Patient also adds that she is trying to cut back on alcohol and is causing her to throw up her meds.

## 2024-02-28 NOTE — ED Provider Notes (Signed)
 [facility] Provider Note   Event Date/Time   First MD Initiated Contact with Patient 02/28/24 1911     (approximate) History  Pneumonia and Emesis  HPI Kelly Patterson is a 38 y.o. female with past medical history of alcohol abuse and recently diagnosed pneumonia who presents complaining of persistent cough, shortness of breath, and lethargy.  Patient also states that she has been trying to quit drinking by tapering down on alcohol without any other medications and has vomited up multiple doses of her antibiotic.  Patient denies any fevers ROS: Patient currently denies any vision changes, tinnitus, difficulty speaking, facial droop, sore throat, chest pain, abdominal pain, nausea/vomiting/diarrhea, dysuria, or weakness/numbness/paresthesias in any extremity   Physical Exam  Triage Vital Signs: ED Triage Vitals  Encounter Vitals Group     BP 02/28/24 1835 116/76     Girls Systolic BP Percentile --      Girls Diastolic BP Percentile --      Boys Systolic BP Percentile --      Boys Diastolic BP Percentile --      Pulse Rate 02/28/24 1835 96     Resp 02/28/24 1835 18     Temp 02/28/24 1835 97.9 F (36.6 C)     Temp Source 02/28/24 1835 Oral     SpO2 02/28/24 1835 99 %     Weight 02/28/24 1836 110 lb (49.9 kg)     Height 02/28/24 1836 5' 6 (1.676 m)     Head Circumference --      Peak Flow --      Pain Score 02/28/24 1836 10     Pain Loc --      Pain Education --      Exclude from Growth Chart --    Most recent vital signs: Vitals:   02/28/24 1835 02/28/24 2000  BP: 116/76 107/70  Pulse: 96 (!) 106  Resp: 18 16  Temp: 97.9 F (36.6 C) 97.9 F (36.6 C)  SpO2: 99% 97%   General: Awake, oriented x4. CV:  Good peripheral perfusion. Resp:  Normal effort. Abd:  No distention. Other:  Middle-aged well-developed, well-nourished Caucasian female resting comfortably in no acute distress eating fast food meal ED Results / Procedures / Treatments   Labs (all labs ordered are listed, but only abnormal results are displayed) Labs Reviewed  LIPASE, BLOOD - Abnormal; Notable for the following components:      Result Value   Lipase 101 (*)    All other components within normal limits  COMPREHENSIVE METABOLIC PANEL WITH GFR - Abnormal; Notable for the following components:   Sodium 134 (*)    Chloride 95 (*)    BUN <5 (*)    AST 85 (*)    All other components within normal limits  CBC - Abnormal; Notable for the following components:   WBC 10.8 (*)    Hemoglobin 15.8 (*)    MCV 106.0 (*)    MCH 36.7 (*)    Platelets 148 (*)    All other components within normal limits  ETHANOL - Abnormal; Notable for the following components:   Alcohol, Ethyl (B) 195 (*)    All other components within normal limits   RADIOLOGY ED MD interpretation: Single view portable chest x-ray shows persistent but improving right basilar consolidation consistent with resolving pneumonia or atelectasis - All radiology independently interpreted and agree with radiology assessment Official radiology report(s): DG Chest 1 View Result Date: 02/28/2024 CLINICAL DATA:  Short of  breath EXAM: CHEST  1 VIEW COMPARISON:  02/24/2024 FINDINGS: Two frontal views of the chest demonstrate an unremarkable cardiac silhouette. There is persistent but improving airspace disease at the right lung base. No effusion or pneumothorax. No acute bony abnormalities. IMPRESSION: 1. Persistent but improving right basilar consolidation consistent with resolving pneumonia or atelectasis. Electronically Signed   By: Ozell Daring M.D.   On: 02/28/2024 19:04   PROCEDURES: Critical Care performed: No Procedures MEDICATIONS ORDERED IN ED: Medications  ondansetron  (ZOFRAN -ODT) disintegrating tablet 4 mg (4 mg Oral Given 02/28/24 1840)   IMPRESSION / MDM / ASSESSMENT AND PLAN / ED COURSE  I reviewed the triage vital signs and the nursing notes.                             The patient is on  the cardiac monitor to evaluate for evidence of arrhythmia and/or significant heart rate changes. Patient's presentation is most consistent with acute presentation with potential threat to life or bodily function. Patient is a 38 year old female with the above-stated past medical history who presents complaining of persistent shortness of breath lethargy in the setting of medication nonadherence due to emesis.  Patient's physical exam as well as laboratory/radiologic evaluation shows downtrending leukocytosis with resolving pneumonia.  Patient encouraged to continue taking her antibiotic on time and as prescribed with a full meal of food.  Patient also given Zofran  for any persistent nausea/vomiting.  Discussed with patient the possibility of using medications to aid in withdrawal however patient states that she feels she can do it on her own.  Patient agrees with plan for discharge at this time with outpatient follow-up as needed.  Patient given strict return precautions and all questions answered prior to discharge.  Dispo: Discharge home with PCP follow-up as needed   FINAL CLINICAL IMPRESSION(S) / ED DIAGNOSES   Final diagnoses:  Pneumonia of right lower lobe due to infectious organism  Alcohol withdrawal syndrome without complication (HCC)   Rx / DC Orders   ED Discharge Orders          Ordered    ondansetron  (ZOFRAN -ODT) 4 MG disintegrating tablet  Every 8 hours PRN        02/28/24 2025           Note:  This document was prepared using Dragon voice recognition software and may include unintentional dictation errors.   Ruthvik Barnaby K, MD 02/28/24 2100

## 2024-03-05 DIAGNOSIS — R059 Cough, unspecified: Secondary | ICD-10-CM | POA: Diagnosis not present

## 2024-03-05 DIAGNOSIS — J449 Chronic obstructive pulmonary disease, unspecified: Secondary | ICD-10-CM | POA: Diagnosis not present

## 2024-03-05 DIAGNOSIS — H9209 Otalgia, unspecified ear: Secondary | ICD-10-CM | POA: Diagnosis not present

## 2024-03-05 DIAGNOSIS — R918 Other nonspecific abnormal finding of lung field: Secondary | ICD-10-CM | POA: Diagnosis not present

## 2024-03-05 DIAGNOSIS — F17200 Nicotine dependence, unspecified, uncomplicated: Secondary | ICD-10-CM | POA: Diagnosis not present

## 2024-03-05 DIAGNOSIS — R0602 Shortness of breath: Secondary | ICD-10-CM | POA: Diagnosis not present

## 2024-03-05 DIAGNOSIS — R072 Precordial pain: Secondary | ICD-10-CM | POA: Diagnosis not present

## 2024-03-05 DIAGNOSIS — B370 Candidal stomatitis: Secondary | ICD-10-CM | POA: Diagnosis not present

## 2024-03-05 DIAGNOSIS — Z7951 Long term (current) use of inhaled steroids: Secondary | ICD-10-CM | POA: Diagnosis not present

## 2024-03-05 DIAGNOSIS — J029 Acute pharyngitis, unspecified: Secondary | ICD-10-CM | POA: Diagnosis not present
